# Patient Record
Sex: Male | Born: 1945 | Race: White | Hispanic: Yes | Marital: Married | Smoking: Former smoker
Health system: Southern US, Community
[De-identification: ages and names within clinical notes are randomized; demographics above are authoritative.]

## PROBLEM LIST (undated history)

## (undated) DIAGNOSIS — M1990 Unspecified osteoarthritis, unspecified site: Secondary | ICD-10-CM

## (undated) DIAGNOSIS — T7840XA Allergy, unspecified, initial encounter: Secondary | ICD-10-CM

## (undated) DIAGNOSIS — H269 Unspecified cataract: Secondary | ICD-10-CM

## (undated) DIAGNOSIS — F419 Anxiety disorder, unspecified: Secondary | ICD-10-CM

## (undated) HISTORY — DX: Anxiety disorder, unspecified: F41.9

## (undated) HISTORY — DX: Unspecified osteoarthritis, unspecified site: M19.90

## (undated) HISTORY — DX: Allergy, unspecified, initial encounter: T78.40XA

## (undated) HISTORY — DX: Unspecified cataract: H26.9

## (undated) HISTORY — PX: EYE SURGERY: SHX253

## (undated) HISTORY — PX: VASECTOMY: SHX75

---

## 2018-11-29 ENCOUNTER — Ambulatory Visit (INDEPENDENT_AMBULATORY_CARE_PROVIDER_SITE_OTHER): Payer: Self-pay | Admitting: Urology

## 2018-11-29 ENCOUNTER — Other Ambulatory Visit: Payer: Self-pay

## 2018-11-29 ENCOUNTER — Encounter: Payer: Self-pay | Admitting: Urology

## 2018-11-29 VITALS — BP 176/78 | HR 63 | Ht 72.0 in | Wt 223.0 lb

## 2018-11-29 DIAGNOSIS — R310 Gross hematuria: Secondary | ICD-10-CM

## 2018-11-29 LAB — URINALYSIS, COMPLETE
Bilirubin, UA: NEGATIVE
Glucose, UA: NEGATIVE
Ketones, UA: NEGATIVE
Leukocytes,UA: NEGATIVE
Nitrite, UA: NEGATIVE
Protein,UA: NEGATIVE
Specific Gravity, UA: 1.02 (ref 1.005–1.030)
Urobilinogen, Ur: 1 mg/dL (ref 0.2–1.0)
pH, UA: 6.5 (ref 5.0–7.5)

## 2018-11-29 LAB — MICROSCOPIC EXAMINATION: Bacteria, UA: NONE SEEN

## 2018-11-29 NOTE — Progress Notes (Signed)
11/29/2018 1:28 PM   Logan Hartman 07/29/1945 [id number]  Referring provider: Kennyth Lose, PA [address] [place], [place] [address]  Chief Complaint  Patient presents with  . Hematuria    HPI:  Logan Hartman was referred over for gross hematuria.  In several occasions he noted red urine with some small clots.  A urinalysis was done and although it was only a dip the color was noted is red appearing.  He had a negative KUB.  He was treated with Cefdinir. Abx seemed to resolve all symptoms including mild dysuria. He has occasional bilateral flank pain. No h/o stones, but he thought he may have seen something small pass when he voided. He noticed symptoms 4-6 weeks ago.   He smoked for about 15 yrs but stopped long ago. He served in [employer] in [place]. No chemo or XRT. 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   .Modifying factors: There are no other modifying factors  Associated signs and symptoms: There are no other associated signs and symptoms Aggravating and relieving factors: There are no other aggravating or relieving factors Severity: Moderate Duration: Persistent  PMH: History reviewed. No pertinent past medical history.  Surgical History: History reviewed. No pertinent surgical history.  Home Medications:  Allergies as of 11/29/2018   No Known Allergies     Medication List    as of November 29, 2018  1:28 PM   You have not been prescribed any medications.     Allergies: No Known Allergies  Family History: History reviewed. No pertinent family history.  Social History:  reports that he has quit smoking. He has never used smokeless tobacco. No history on file for alcohol and drug.  ROS: UROLOGY Frequent Urination?: No Hard to postpone urination?: No Burning/pain with urination?: No Get up at night to urinate?: No Leakage of urine?: No Urine stream starts and stops?: No Trouble starting stream?: No Do you have to strain to urinate?:  No Blood in urine?: Yes Urinary tract infection?: No Sexually transmitted disease?: No Injury to kidneys or bladder?: No Painful intercourse?: No Weak stream?: No Erection problems?: No Penile pain?: No  Gastrointestinal Nausea?: No Vomiting?: No Indigestion/heartburn?: No Diarrhea?: No Constipation?: No  Constitutional Fever: No Night sweats?: No Weight loss?: No Fatigue?: No  Skin Skin rash/lesions?: No Itching?: No  Eyes Blurred vision?: No Double vision?: No  Ears/Nose/Throat Sore throat?: No Sinus problems?: No  Hematologic/Lymphatic Swollen glands?: No Easy bruising?: No  Cardiovascular Leg swelling?: No Chest pain?: No  Respiratory Cough?: No Shortness of breath?: No  Endocrine Excessive thirst?: No  Musculoskeletal Back pain?: No Joint pain?: No  Neurological Headaches?: No Dizziness?: No  Psychologic Depression?: No Anxiety?: No  Physical Exam: BP (!) 176/78 (BP Location: Left Arm, Patient Position: Sitting, Cuff Size: Normal)   Pulse 63   Ht 6' (1.829 m)   Wt 101.2 kg   BMI 30.24 kg/m   Constitutional:  Alert and oriented, No acute distress. HEENT: [place] AT, moist mucus membranes.  Trachea midline, no masses. Cardiovascular: No clubbing, cyanosis, or edema. Respiratory: Normal respiratory effort, no increased work of breathing. GI: Abdomen is soft, nontender, nondistended, no abdominal masses GU: No CVA tenderness Lymph: No cervical or inguinal lymphadenopathy. Skin: No rashes, bruises or suspicious lesions. Neurologic: Grossly intact, no focal deficits, moving all 4 extremities. Psychiatric: Normal mood and affect. GU: Penis circumcised, normal foreskin, testicles descended bilaterally and palpably normal, bilateral epididymis palpably normal, scrotum normal DRE: Prostate 40  g, smooth without hard area or nodule  Laboratory Data: No results found for: WBC, HGB, HCT, MCV, PLT  No results found for: CREATININE  No results  found for: PSA  No results found for: TESTOSTERONE  No results found for: HGBA1C  Urinalysis No results found for: COLORURINE, APPEARANCEUR, LABSPEC, PHURINE, GLUCOSEU, HGBUR, BILIRUBINUR, KETONESUR, PROTEINUR, UROBILINOGEN, NITRITE, LEUKOCYTESUR  No results found for: LABMICR, WBCUA, RBCUA, LABEPIT, MUCUS, BACTERIA  Pertinent Imaging:  No results found for this or any previous visit. No results found for this or any previous visit. No results found for this or any previous visit. No results found for this or any previous visit. No results found for this or any previous visit. No results found for this or any previous visit. No results found for this or any previous visit. No results found for this or any previous visit.  Assessment & Plan:    1. Gross hematuria Discussed eval with CT with IV and cystoscopy. Also PSA was sent.   - Urinalysis, Complete   No follow-ups on file.  Jerilee FieldMatthew Annabelle Rexroad, MD  [facility] [address], [address] [place], [place] [id number] [phone]

## 2018-11-30 LAB — PSA: Prostate Specific Ag, Serum: 0.8 ng/mL (ref 0.0–4.0)

## 2018-12-06 ENCOUNTER — Telehealth: Payer: Self-pay | Admitting: Urology

## 2018-12-09 ENCOUNTER — Other Ambulatory Visit
Admission: RE | Admit: 2018-12-09 | Discharge: 2018-12-09 | Disposition: A | Discharge status: Home / Self Care | Payer: Non-veteran care | Attending: Urology | Admitting: Urology

## 2018-12-09 ENCOUNTER — Ambulatory Visit
Admission: RE | Admit: 2018-12-09 | Discharge: 2018-12-09 | Disposition: A | Discharge status: Home / Self Care | Payer: Self-pay | Source: Ambulatory Visit | Attending: Urology | Admitting: Urology

## 2018-12-09 ENCOUNTER — Other Ambulatory Visit: Payer: Self-pay

## 2018-12-09 DIAGNOSIS — R310 Gross hematuria: Secondary | ICD-10-CM | POA: Insufficient documentation

## 2018-12-09 LAB — CREATININE, SERUM
Creatinine, Ser: 0.69 mg/dL (ref 0.61–1.24)
GFR calc Af Amer: >60 mL/min (ref >60)
GFR calc non Af Amer: >60 mL/min (ref >60)

## 2018-12-09 LAB — BUN: BUN: 12 mg/dL (ref 8–23)

## 2018-12-09 MED ORDER — IOHEXOL 300 MG/ML  SOLN
125.0000 mL | Freq: Once | INTRAMUSCULAR | Status: AC | PRN
  Administered 2018-12-09: 125 mL via INTRAVENOUS

## 2018-12-11 ENCOUNTER — Other Ambulatory Visit: Payer: Self-pay

## 2018-12-11 ENCOUNTER — Ambulatory Visit (INDEPENDENT_AMBULATORY_CARE_PROVIDER_SITE_OTHER): Payer: Self-pay | Admitting: Urology

## 2018-12-11 ENCOUNTER — Encounter: Payer: Self-pay | Admitting: Urology

## 2018-12-11 VITALS — BP 172/82 | HR 57 | Ht 72.0 in | Wt 223.0 lb

## 2018-12-11 DIAGNOSIS — R310 Gross hematuria: Secondary | ICD-10-CM

## 2018-12-11 MED ORDER — LIDOCAINE HCL URETHRAL/MUCOSAL 2 % EX GEL
1.0000 "application " | Freq: Once | CUTANEOUS | Status: AC
  Administered 2018-12-11: 1 via URETHRAL

## 2018-12-11 NOTE — Progress Notes (Signed)
12/11/2018 2:46 PM   Charlesetta ShanksGilbert Michl 03-Sep-1945 [id number]  Referring provider: No referring provider defined for this encounter.  Chief Complaint  Patient presents with  . Hematuria    HPI:  Mr. Logan Hartman returns in evaluation for gross hematuria. On several occasions he noted red urine with some small clots prior to 11/29/2018.  A urinalysis was done and although it was only a dip the color was noted is red appearing.  He had a negative KUB.  He was treated with Cefdinir. Abx seemed to resolve all symptoms including mild dysuria. He has occasional bilateral flank pain. No h/o stones, but he thought he may have seen something small pass when he voided. He noticed symptoms 4-6 weeks ago.   He smoked for about 15 yrs but stopped long ago. He served in [employer] in [place]. No chemo or XRT. 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   He underwent CT scan a/p with IV contrast 12/09/2018 which was benign. He is here to discuss CT and for cystoscopy. I reviewed all the images.    PMH: No past medical history on file.  Surgical History: No past surgical history on file.  Home Medications:  Allergies as of 12/11/2018   No Known Allergies     Medication List    as of December 11, 2018  2:46 PM   You have not been prescribed any medications.     Allergies: No Known Allergies  Family History: No family history on file.  Social History:  reports that he has quit smoking. He has never used smokeless tobacco. No history on file for alcohol and drug.  ROS:                                        Physical Exam:  NED. A&Ox3.   No respiratory distress   Abd soft, NT, ND Normal phallus with bilateral descended testicles, meatus normal   Cystoscopy Procedure Note  Patient identification was confirmed, informed consent was obtained, and patient was prepped using Betadine solution.  Lidocaine jelly was administered per urethral meatus.      Pre-Procedure: - Inspection reveals a normal caliber ureteral meatus.  Procedure: The flexible cystoscope was introduced without difficulty - No urethral strictures/lesions are present. - Enlarged prostate , mild hyperplasia, borderline obstructing, friable vessels near the veru - Normal bladder neck - Bilateral ureteral orifices identified - Bladder mucosa  reveals no ulcers, tumors, or lesions - No bladder stones - No trabeculation  Retroflexion shows normal bladder neck/bladder.   Post-Procedure: - Patient tolerated the procedure well   Laboratory Data: No results found for: WBC, HGB, HCT, MCV, PLT  Lab Results  Component Value Date   CREATININE 0.69 12/09/2018    No results found for: PSA  No results found for: TESTOSTERONE  No results found for: HGBA1C  Urinalysis    Component Value Date/Time   APPEARANCEUR Clear 11/29/2018 1315   GLUCOSEU Negative 11/29/2018 1315   BILIRUBINUR Negative 11/29/2018 1315   PROTEINUR Negative 11/29/2018 1315   NITRITE Negative 11/29/2018 1315   LEUKOCYTESUR Negative 11/29/2018 1315    Lab Results  Component Value Date   LABMICR See below: 11/29/2018   WBCUA 0-5 11/29/2018   LABEPIT 0-10 11/29/2018   BACTERIA None seen 11/29/2018    Pertinent Imaging: CT scan No results found for this or any previous visit. No results found for  this or any previous visit. No results found for this or any previous visit. No results found for this or any previous visit. No results found for this or any previous visit. No results found for this or any previous visit. No results found for this or any previous visit. No results found for this or any previous visit.  Assessment & Plan:    1. Gross hematuria Benign eval - see back in 1 year for UA and H&P or sooner if any flank pain, gross hematuria, dysuria/LUTS, etc.  - Urinalysis, Complete - lidocaine (XYLOCAINE) 2 % jelly 1 application   No follow-ups on file.  Festus Aloe, MD  [facility] [address], [place], [place] [address] [phone]

## 2018-12-11 NOTE — Patient Instructions (Signed)
Cystoscopy    Cystoscopy is a procedure that is used to help diagnose and sometimes treat conditions that affect that lower urinary tract. The lower urinary tract includes the bladder and the tube that drains urine from the bladder out of the body (urethra). Cystoscopy is performed with a thin, tube-shaped instrument with a light and camera at the end (cystoscope). The cystoscope may be hard (rigid) or flexible, depending on the goal of the procedure.The cystoscope is inserted through the urethra, into the bladder.  Cystoscopy may be recommended if you have:   Urinary tractinfections that keep coming back (recurring).   Blood in the urine (hematuria).   Loss of bladder control (urinary incontinence) or an overactive bladder.   Unusual cells found in a urine sample.   A blockage in the urethra.   Painful urination.   An abnormality in the bladder found during an intravenous pyelogram (IVP) or CT scan.  Cystoscopy may also be done to remove a sample of tissue to be examined under a microscope (biopsy).  Tell a health care provider about:   Any allergies you have.   All medicines you are taking, including vitamins, herbs, eye drops, creams, and over-the-counter medicines.   Any problems you or family members have had with anesthetic medicines.   Any blood disorders you have.   Any surgeries you have had.   Any medical conditions you have.   Whether you are pregnant or may be pregnant.  What are the risks?  Generally, this is a safe procedure. However, problems may occur, including:   Infection.   Bleeding.   Allergic reactions to medicines.   Damage to other structures or organs.  What happens before the procedure?   Ask your health care provider about:  ? Changing or stopping your regular medicines. This is especially important if you are taking diabetes medicines or blood thinners.  ? Taking medicines such as aspirin and ibuprofen. These medicines can thin your blood. Do not take these medicines  before your procedure if your health care provider instructs you not to.   Follow instructions from your health care provider about eating or drinking restrictions.   You may be given antibiotic medicine to help prevent infection.   You may have an exam or testing, such as X-rays of the bladder, urethra, or kidneys.   You may have urine tests to check for signs of infection.   Plan to have someone take you home after the procedure.  What happens during the procedure?   To reduce your risk of infection,your health care team will wash or sanitize their hands.   You will be given one or more of the following:  ? A medicine to help you relax (sedative).  ? A medicine to numb the area (local anesthetic).   The area around the opening of your urethra will be cleaned.   The cystoscope will be passed through your urethra into your bladder.   Germ-free (sterile)fluid will flow through the cystoscope to fill your bladder. The fluid will stretch your bladder so that your surgeon can clearly examine your bladder walls.   The cystoscope will be removed and your bladder will be emptied.  The procedure may vary among health care providers and hospitals.  What happens after the procedure?   You may have some soreness or pain in your abdomen and urethra. Medicines will be available to help you.   You may have some blood in your urine.   Do not drive for   24 hours if you received a sedative.  This information is not intended to replace advice given to you by your health care provider. Make sure you discuss any questions you have with your health care provider.  Document Released: 06/09/2000 Document Revised: 03/23/2017 Document Reviewed: 04/29/2015  Elsevier Interactive Patient Education  2019 Elsevier Inc.

## 2018-12-12 LAB — URINALYSIS, COMPLETE
Bilirubin, UA: NEGATIVE
Glucose, UA: NEGATIVE
Ketones, UA: NEGATIVE
Leukocytes,UA: NEGATIVE
Nitrite, UA: NEGATIVE
Protein,UA: NEGATIVE
RBC, UA: NEGATIVE
Specific Gravity, UA: 1.02 (ref 1.005–1.030)
Urobilinogen, Ur: 2 mg/dL — ABNORMAL HIGH (ref 0.2–1.0)
pH, UA: 7 (ref 5.0–7.5)

## 2018-12-12 LAB — MICROSCOPIC EXAMINATION
Bacteria, UA: NONE SEEN
RBC, Urine: NONE SEEN /hpf (ref 0–2)

## 2019-01-18 NOTE — Telephone Encounter (Signed)
error 

## 2019-10-24 ENCOUNTER — Ambulatory Visit (INDEPENDENT_AMBULATORY_CARE_PROVIDER_SITE_OTHER): Payer: Medicaid Other | Admitting: Urology

## 2019-10-24 ENCOUNTER — Other Ambulatory Visit: Payer: Self-pay

## 2019-10-24 VITALS — BP 181/95 | HR 69 | Ht 68.0 in | Wt 237.0 lb

## 2019-10-24 DIAGNOSIS — N5201 Erectile dysfunction due to arterial insufficiency: Secondary | ICD-10-CM | POA: Diagnosis not present

## 2019-10-24 DIAGNOSIS — R310 Gross hematuria: Secondary | ICD-10-CM | POA: Diagnosis not present

## 2019-10-24 MED ORDER — SILDENAFIL CITRATE 20 MG PO TABS: 20.0000 mg | ORAL_TABLET | Freq: Every day | ORAL | 11 refills | Status: DC

## 2019-10-24 NOTE — Patient Instructions (Signed)
Sildenafil tablets (Erectile Dysfunction) What is this medicine? SILDENAFIL (sil DEN a fil) is used to treat erection problems in men. This medicine may be used for other purposes; ask your health care provider or pharmacist if you have questions. COMMON BRAND NAME(S): Viagra What should I tell my health care provider before I take this medicine? They need to know if you have any of these conditions:  bleeding disorders  eye or vision problems, including a rare inherited eye disease called retinitis pigmentosa  anatomical deformation of the penis, Peyronie's disease, or history of priapism (painful and prolonged erection)  heart disease, angina, a history of heart attack, irregular heart beats, or other heart problems  high or low blood pressure  history of blood diseases, like sickle cell anemia or leukemia  history of stomach bleeding  kidney disease  liver disease  stroke  an unusual or allergic reaction to sildenafil, other medicines, foods, dyes, or preservatives  pregnant or trying to get pregnant  breast-feeding How should I use this medicine? Take this medicine by mouth with a glass of water. Follow the directions on the prescription label. The dose is usually taken 1 hour before sexual activity. You should not take the dose more than once per day. Do not take your medicine more often than directed. Talk to your pediatrician regarding the use of this medicine in children. This medicine is not used in children for this condition. Overdosage: If you think you have taken too much of this medicine contact a poison control center or emergency room at once. NOTE: This medicine is only for you. Do not share this medicine with others. What if I miss a dose? This does not apply. Do not take double or extra doses. What may interact with this medicine? Do not take this medicine with any of the following medications:  cisapride  nitrates like amyl nitrite, isosorbide  dinitrate, isosorbide mononitrate, nitroglycerin  riociguat This medicine may also interact with the following medications:  antiviral medicines for HIV or AIDS  bosentan  certain medicines for benign prostatic hyperplasia (BPH)  certain medicines for blood pressure  certain medicines for fungal infections like ketoconazole and itraconazole  cimetidine  erythromycin  rifampin This list may not describe all possible interactions. Give your health care provider a list of all the medicines, herbs, non-prescription drugs, or dietary supplements you use. Also tell them if you smoke, drink alcohol, or use illegal drugs. Some items may interact with your medicine. What should I watch for while using this medicine? If you notice any changes in your vision while taking this drug, call your doctor or health care professional as soon as possible. Stop using this medicine and call your health care provider right away if you have a loss of sight in one or both eyes. Contact your doctor or health care professional right away if you have an erection that lasts longer than 4 hours or if it becomes painful. This may be a sign of a serious problem and must be treated right away to prevent permanent damage. If you experience symptoms of nausea, dizziness, chest pain or arm pain upon initiation of sexual activity after taking this medicine, you should refrain from further activity and call your doctor or health care professional as soon as possible. Do not drink alcohol to excess (examples, 5 glasses of wine or 5 shots of whiskey) when taking this medicine. When taken in excess, alcohol can increase your chances of getting a headache or getting dizzy, increasing   your heart rate or lowering your blood pressure. Using this medicine does not protect you or your partner against HIV infection (the virus that causes AIDS) or other sexually transmitted diseases. What side effects may I notice from receiving this  medicine? Side effects that you should report to your doctor or health care professional as soon as possible:  allergic reactions like skin rash, itching or hives, swelling of the face, lips, or tongue  breathing problems  changes in hearing  changes in vision  chest pain  fast, irregular heartbeat  prolonged or painful erection  seizures Side effects that usually do not require medical attention (report to your doctor or health care professional if they continue or are bothersome):  back pain  dizziness  flushing  headache  indigestion  muscle aches  nausea  stuffy or runny nose This list may not describe all possible side effects. Call your doctor for medical advice about side effects. You may report side effects to FDA at 1-800-FDA-1088. Where should I keep my medicine? Keep out of reach of children. Store at room temperature between 15 and 30 degrees C (59 and 86 degrees F). Throw away any unused medicine after the expiration date. NOTE: This sheet is a summary. It may not cover all possible information. If you have questions about this medicine, talk to your doctor, pharmacist, or health care provider.  2020 Elsevier/Gold Standard (2015-05-26 12:00:25)  

## 2019-10-24 NOTE — Progress Notes (Signed)
10/24/2019 8:47 AM   Logan Hartman August 23, 1945 [id number]  Referring provider: No referring provider defined for this encounter.  No chief complaint on file.   HPI:  Logan Hartman returns in evaluation for gross hematuria. On several occasions he noted red urine with some smallclots prior to 11/29/2018. A urinalysis was done and although it was only a dip the color was noted is red appearing. He had a negative KUB. He was treated with Cefdinir. Abx seemed to resolve all symptoms including mild dysuria. He has occasional bilateral flank pain. No h/o stones.  He smoked for about 15 yrs but stopped long ago. He served in [employer] in [place]. No chemo or XRT. 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   He underwent CT scan a/p with IV contrast 12/09/2018 which was benign. Cystoscopy benign 12/11/2018. His PSA was 0.8 in 2020.   He returns today and has had no gross hematuria or flank pain. His stream is slower in the morning and then runs "pretty good" after he is hydrated.   He has a new issue with keeping an erection. He hasn't tried anything. He has a good libido. Going on for several months.   PMH: No past medical history on file.  Surgical History: No past surgical history on file.  Home Medications:  Allergies as of 10/24/2019   No Known Allergies     Medication List    as of October 24, 2019  8:47 AM   You have not been prescribed any medications.     Allergies: No Known Allergies  Family History: No family history on file.  Social History:  reports that he has quit smoking. He has never used smokeless tobacco. No history on file for alcohol and drug.   Physical Exam: There were no vitals taken for this visit.  Constitutional:  Alert and oriented, No acute distress. HEENT: [place] AT, moist mucus membranes.  Trachea midline, no masses. Cardiovascular: No clubbing, cyanosis, or edema. Respiratory: Normal respiratory effort, no increased work of  breathing. GI: Abdomen is soft, nontender, nondistended, no abdominal masses GU: No CVA tenderness; DRE - prostate about 30 g, smooth without hard area or nodule Lymph: No cervical or inguinal lymphadenopathy. Skin: No rashes, bruises or suspicious lesions. Neurologic: Grossly intact, no focal deficits, moving all 4 extremities. Psychiatric: Normal mood and affect.  Laboratory Data: No results found for: WBC, HGB, HCT, MCV, PLT  Lab Results  Component Value Date   CREATININE 0.69 12/09/2018    No results found for: PSA  No results found for: TESTOSTERONE  No results found for: HGBA1C  Urinalysis    Component Value Date/Time   APPEARANCEUR Clear 12/11/2018 1436   GLUCOSEU Negative 12/11/2018 1436   BILIRUBINUR Negative 12/11/2018 1436   PROTEINUR Negative 12/11/2018 1436   NITRITE Negative 12/11/2018 1436   LEUKOCYTESUR Negative 12/11/2018 1436    Lab Results  Component Value Date   LABMICR See below: 12/11/2018   WBCUA 0-5 12/11/2018   LABEPIT 0-10 12/11/2018   BACTERIA None seen 12/11/2018    Pertinent Imaging: n/a No results found for this or any previous visit. No results found for this or any previous visit. No results found for this or any previous visit. No results found for this or any previous visit. No results found for this or any previous visit. No results found for this or any previous visit. No results found for this or any previous visit. No results found for this or any previous visit.  Assessment & Plan:    1. Gross hematuria No recurrence, self limited  - Urinalysis, Complete  2. ED - we discussed the nature r/b/a to sildenafil and he will proceed.   No follow-ups on file.  Jerilee Field, MD  [facility] [address], [address] [place], [place] [address] [phone]

## 2019-12-05 ENCOUNTER — Ambulatory Visit: Payer: Self-pay | Admitting: Urology

## 2020-04-29 IMAGING — CT CT ABDOMEN AND PELVIS WITHOUT AND WITH CONTRAST
3 of 7 series · 13 of 32 positions shown, 18 images · IV contrast (omnipaque)
Comparison: None.

CLINICAL DATA: Patient with history of gross hematuria.

EXAM:
CT ABDOMEN AND PELVIS WITHOUT AND WITH CONTRAST
TECHNIQUE: Multidetector CT imaging of the abdomen and pelvis was performed
following the standard protocol before and following the bolus
administration of intravenous contrast.
CONTRAST:  125mL OMNIPAQUE IOHEXOL 300 MG/ML  SOLN

[Series 2: axial pre · axial · non-contrast · 0.79mm/px · z∈[-480,-320]mm · 3 of 95 slices shown]
[im 16/95  soft-tissue]
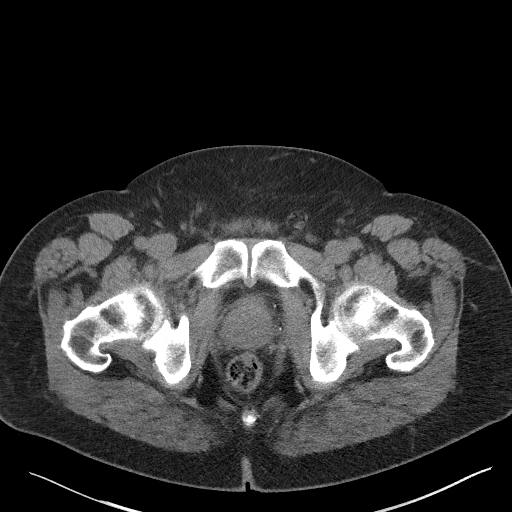
[im 32/95  soft-tissue]
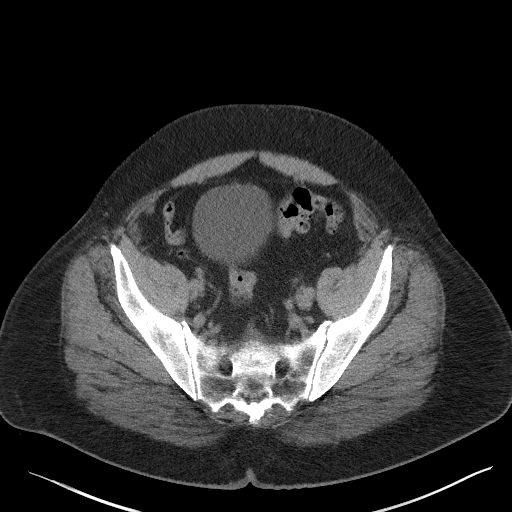
[im 48/95  soft-tissue]
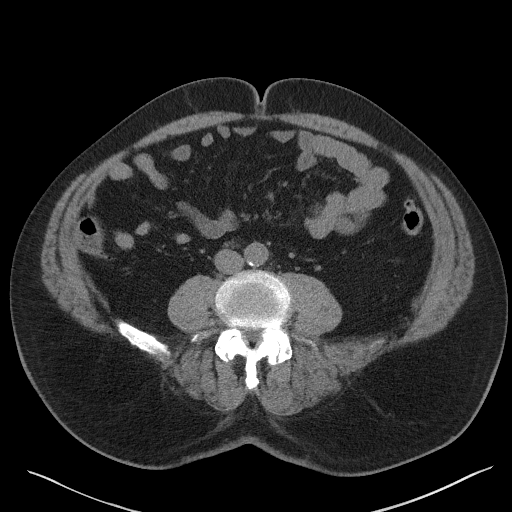

[Series 13: axial delay · axial · delayed · 0.74mm/px · z∈[-524,-189]mm · 5 of 101 slices shown, 10 images (1 of 2)]
[im 17/101  soft-tissue]
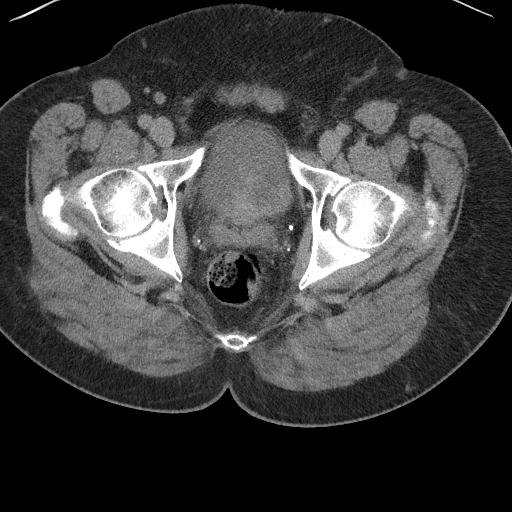
[im 17/101  bone]
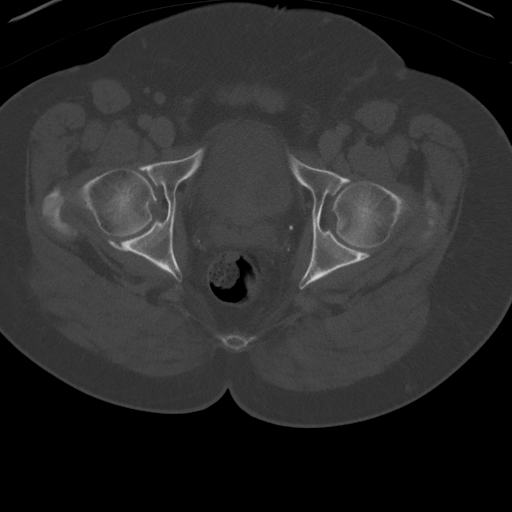
[im 34/101  soft-tissue]
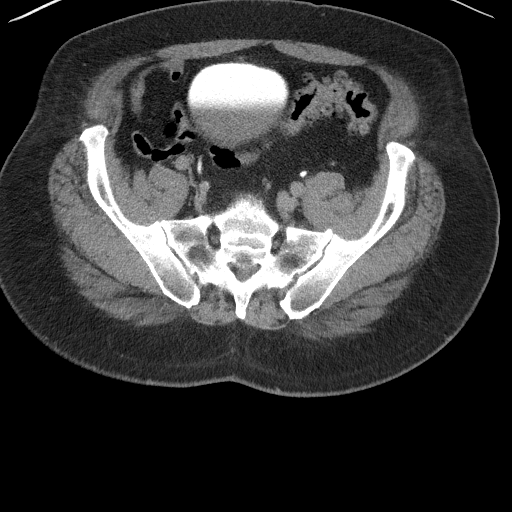
[im 34/101  lung]
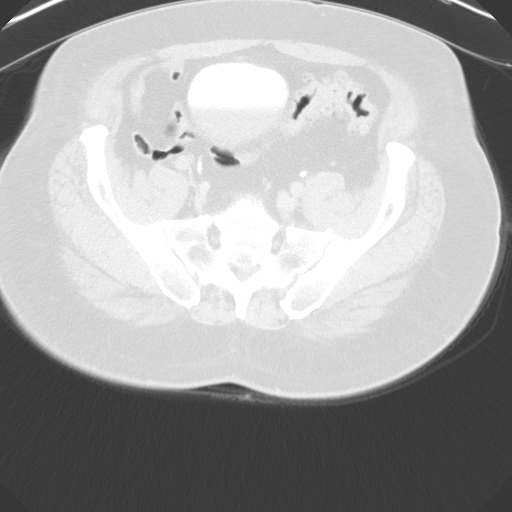
[im 51/101  soft-tissue]
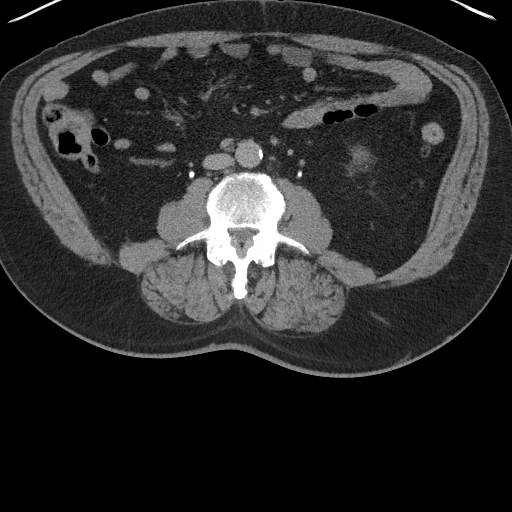
[im 51/101  lung]
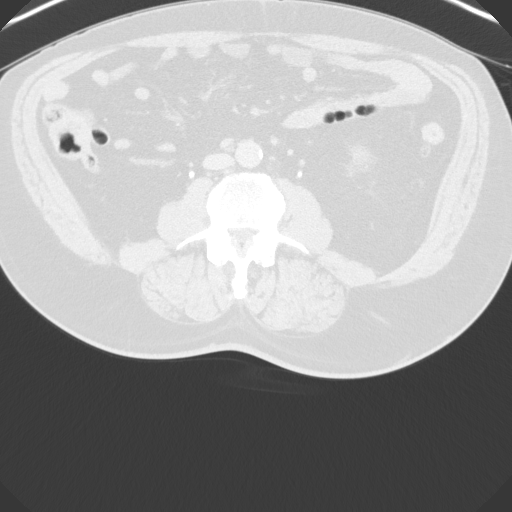
[im 67/101  soft-tissue]
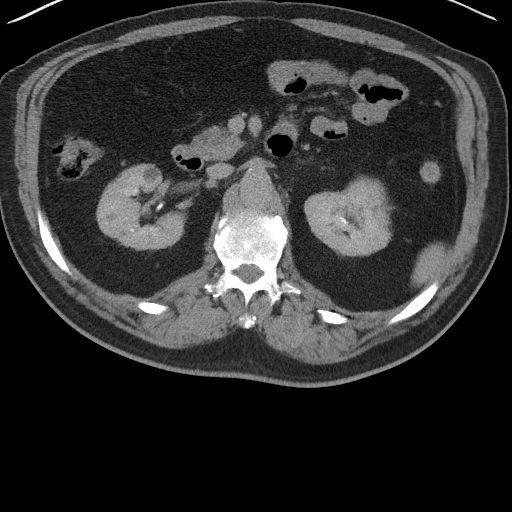
[im 67/101  lung]
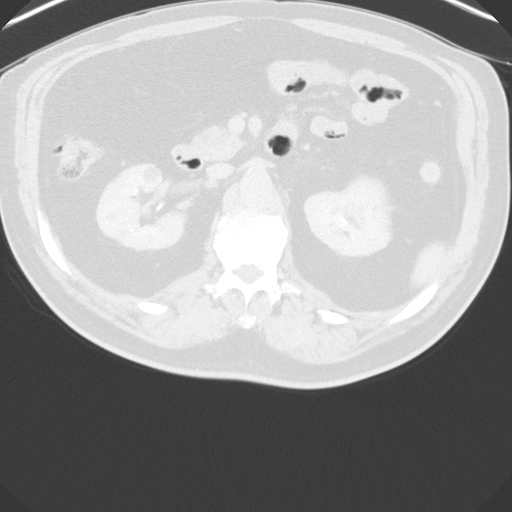
[im 84/101  soft-tissue]
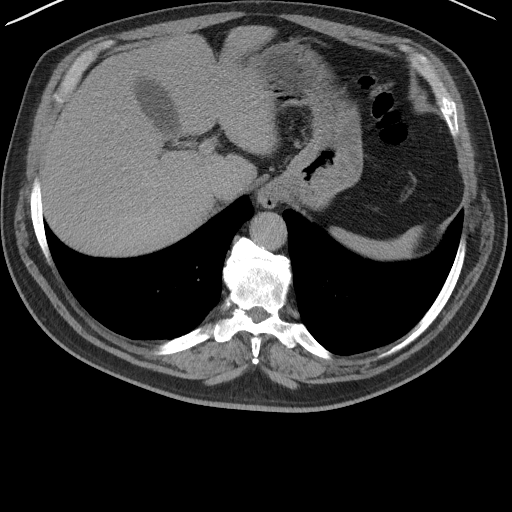
[im 84/101  lung]
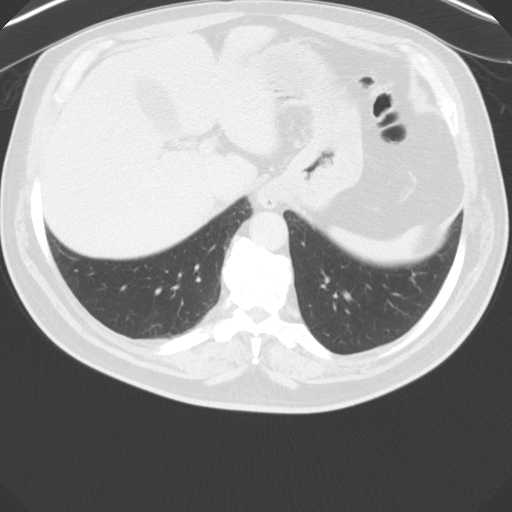

[Series 15: axial delay · axial · delayed · 0.77mm/px · z∈[-524,-189]mm · 5 of 101 slices shown (2 of 2)]
[im 17/101  soft-tissue]
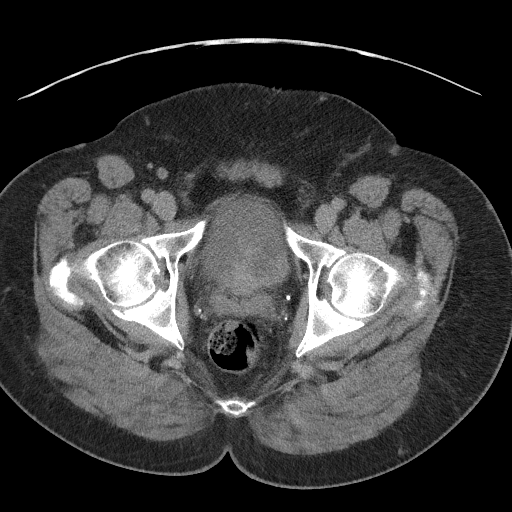
[im 34/101  soft-tissue]
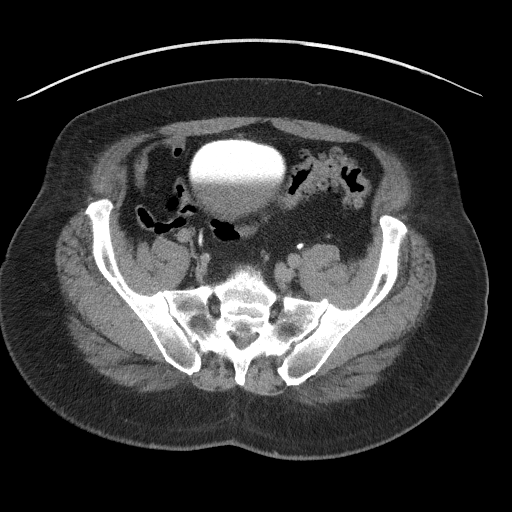
[im 51/101  soft-tissue]
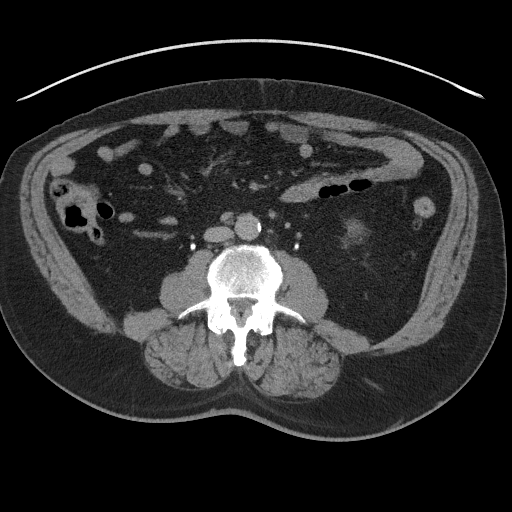
[im 67/101  soft-tissue]
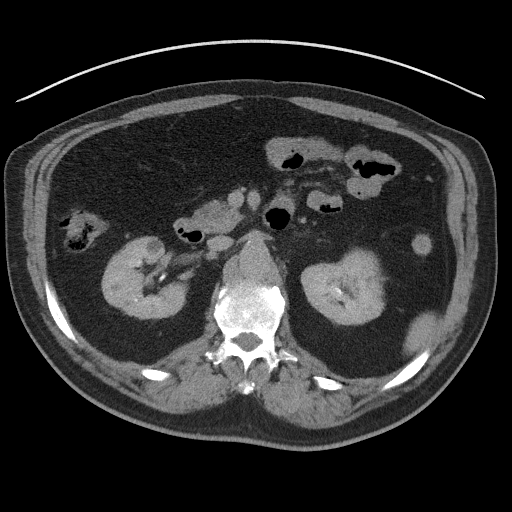
[im 84/101  soft-tissue]
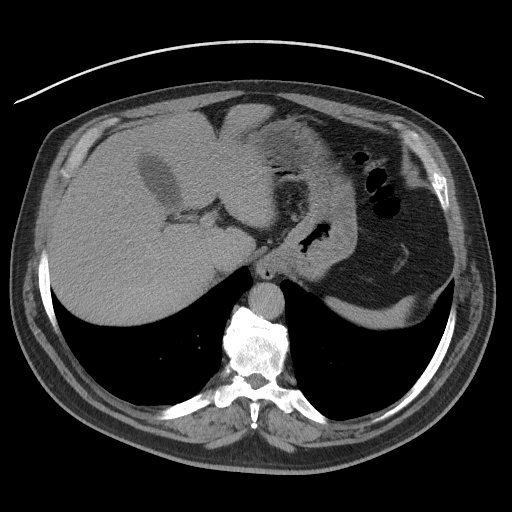

[13 of 32 positions shown; findings below may reference images not displayed]

FINDINGS: Lower chest: Normal heart size. Small calcified granuloma within the
right lower lobe. No pleural effusion or pneumothorax.

Hepatobiliary: The liver is normal in size and contour. Focal
hepatic lesion is identified. Gallbladder is unremarkable. No
intrahepatic or extrahepatic biliary ductal dilatation.

Pancreas: Unremarkable

Spleen: Unremarkable

Adrenals/Urinary Tract: Normal adrenal glands. Noncontrast images
demonstrate a punctate stone within the interpolar region right
kidney (image 59; series 6). Additionally, there is a 2 mm
calcification within the interpolar region right kidney (image 69;
series 6) which may represent small stone versus intraparenchymal
calcification. No ureterolithiasis. No hydronephrosis. Kidneys
enhance symmetrically with contrast. Multiple bilateral
low-attenuation renal lesions are demonstrated, the largest of which
are compatible with simple cysts. The majority of these lesions are
too small to characterize. Delayed images demonstrate excretion of
contrast material into the bilateral renal collecting systems and
ureters. The opacified portions are unremarkable. No abnormal
filling defects are identified. Urinary bladder is unremarkable.

Stomach/Bowel: Sigmoid colonic diverticulosis. No CT evidence for
acute diverticulitis. Normal appendix. Normal morphology of the
stomach. No evidence for small bowel obstruction. No free fluid or
free intraperitoneal air.

Vascular/Lymphatic: Normal caliber abdominal aorta. Prominent
subcentimeter retroperitoneal lymph nodes.

Reproductive: Coarse heterogeneous calcifications in the prostate.

Other: Small fat containing right inguinal hernia.

Musculoskeletal: Lumbar spine degenerative changes. No aggressive or
acute appearing osseous lesions.
IMPRESSION: 1. There is a punctate stone within the midpole of the right kidney.
There is an additional calcification within the midpole right kidney
which may represent small stone versus parenchymal calcification.
2. No ureterolithiasis or hydronephrosis. Abnormal filling defects
within the opacified renal collecting systems and ureters.
3. Multiple bilateral low-attenuation renal lesions, the largest of
which are compatible with simple cysts. Many of these lesions are
too small to characterize, particularly given the intraparenchymal
location.

## 2020-10-22 ENCOUNTER — Ambulatory Visit: Payer: Self-pay | Admitting: Urology

## 2020-11-12 ENCOUNTER — Encounter: Payer: Self-pay | Admitting: Urology

## 2020-11-12 ENCOUNTER — Ambulatory Visit (INDEPENDENT_AMBULATORY_CARE_PROVIDER_SITE_OTHER): Payer: Medicaid Other | Admitting: Urology

## 2020-11-12 ENCOUNTER — Other Ambulatory Visit: Payer: Self-pay

## 2020-11-12 VITALS — BP 184/82 | HR 59 | Ht 67.0 in | Wt 215.0 lb

## 2020-11-12 DIAGNOSIS — R310 Gross hematuria: Secondary | ICD-10-CM | POA: Diagnosis not present

## 2020-11-12 DIAGNOSIS — N5201 Erectile dysfunction due to arterial insufficiency: Secondary | ICD-10-CM

## 2020-11-12 LAB — URINALYSIS, COMPLETE
Bilirubin, UA: NEGATIVE
Glucose, UA: NEGATIVE
Ketones, UA: NEGATIVE
Leukocytes,UA: NEGATIVE
Nitrite, UA: NEGATIVE
Protein,UA: NEGATIVE
RBC, UA: NEGATIVE
Specific Gravity, UA: 1.01 (ref 1.005–1.030)
Urobilinogen, Ur: 1 mg/dL (ref 0.2–1.0)
pH, UA: 6.5 (ref 5.0–7.5)

## 2020-11-12 LAB — MICROSCOPIC EXAMINATION
Bacteria, UA: NONE SEEN
RBC, Urine: NONE SEEN /hpf (ref 0–2)

## 2020-11-12 MED ORDER — SILDENAFIL CITRATE 20 MG PO TABS: 20.0000 mg | ORAL_TABLET | Freq: Every day | ORAL | 3 refills | Status: DC

## 2020-11-12 NOTE — Progress Notes (Signed)
   11/12/2020 9:50 AM   Logan Hartman 1946/04/15 [id number]  Referring provider: No referring provider defined for this encounter.  Chief Complaint  Patient presents with  . Hematuria    HPI: 75 y.o. male presents for annual follow-up.     Previously followed by Dr. Mena Goes for gross hematuria  CT urogram 11/2018 without upper tract abnormalities  Cystoscopy 11/2018 remarkable for prostate enlargement with hypervascularity/friability  Last visit 09/2019 and denied recurrent gross hematuria and was started on sildenafil for ED  Denies recurrent hematuria or voiding complaints  Sildenafil has been effective and a 20 mg dose   PMH: History reviewed. No pertinent past medical history.  Surgical History: History reviewed. No pertinent surgical history.  Home Medications:  Allergies as of 11/12/2020   No Known Allergies     Medication List       Accurate as of Nov 12, 2020  9:50 AM. If you have any questions, ask your nurse or doctor.        sildenafil 20 MG tablet Commonly known as: REVATIO Take 1 tablet (20 mg total) by mouth daily. Take 1-5 tablets as needed       Allergies: No Known Allergies  Family History: History reviewed. No pertinent family history.  Social History:  reports that he has quit smoking. He has never used smokeless tobacco. No history on file for alcohol use and drug use.   Physical Exam: BP (!) 184/82   Pulse (!) 59   Ht 5\' 7"  (1.702 m)   Wt 215 lb (97.5 kg)   BMI 33.67 kg/m   Constitutional:  Alert and oriented, No acute distress. HEENT: [place] AT, moist mucus membranes.  Trachea midline, no masses. Cardiovascular: No clubbing, cyanosis, or edema. Respiratory: Normal respiratory effort, no increased work of breathing.    Assessment & Plan:    1. Gross hematuria  Stable, no recurrent gross hematuria  2.  Erectile dysfunction  Stable  Sildenafil effective at 20 mg dose  Refill sent to pharmacy  Follow-up 1  year   , MD  [facility] [address] [place], [place] [phone]

## 2020-12-29 ENCOUNTER — Other Ambulatory Visit: Payer: Self-pay | Admitting: *Deleted

## 2020-12-29 MED ORDER — SILDENAFIL CITRATE 20 MG PO TABS: 20.0000 mg | ORAL_TABLET | Freq: Every day | ORAL | 3 refills | Status: DC

## 2021-06-07 ENCOUNTER — Encounter: Payer: Self-pay | Admitting: Urology

## 2021-11-14 ENCOUNTER — Ambulatory Visit: Payer: Self-pay | Admitting: Urology

## 2021-11-16 ENCOUNTER — Encounter: Payer: Self-pay | Admitting: Urology

## 2021-11-16 ENCOUNTER — Ambulatory Visit (INDEPENDENT_AMBULATORY_CARE_PROVIDER_SITE_OTHER): Payer: Medicare Other | Admitting: Urology

## 2021-11-16 VITALS — BP 140/72 | HR 68 | Ht 67.0 in | Wt 210.0 lb

## 2021-11-16 DIAGNOSIS — N5201 Erectile dysfunction due to arterial insufficiency: Secondary | ICD-10-CM | POA: Diagnosis not present

## 2021-11-16 MED ORDER — SILDENAFIL CITRATE 20 MG PO TABS: 20.0000 mg | ORAL_TABLET | Freq: Every day | ORAL | 3 refills | Status: DC

## 2021-11-16 NOTE — Progress Notes (Signed)
   11/16/2021 11:29 AM   Logan Hartman 1945/09/27 [id number]  Referring provider: No referring provider defined for this encounter.  Chief Complaint  Patient presents with   Erectile Dysfunction    Urologic history: 1.  Gross hematuria June 2020: CTU unremarkable; cystoscopy BPH with prominent vasculature/friability  2.  Erectile dysfunction Sildenafil prn  HPI: 76 y.o. male presents for annual follow-up.    Doing well since last visit No bothersome LUTS Denies dysuria, recurrent gross hematuria Denies flank, abdominal or pelvic pain Sildenafil effective at 20 mg dose  PMH: History reviewed. No pertinent past medical history.  Surgical History: History reviewed. No pertinent surgical history.  Home Medications:  Allergies as of 11/16/2021   No Known Allergies      Medication List        Accurate as of Nov 16, 2021 11:29 AM. If you have any questions, ask your nurse or doctor.          sildenafil 20 MG tablet Commonly known as: REVATIO Take 1 tablet (20 mg total) by mouth daily. Take 1-5 tablets as needed        Allergies: No Known Allergies  Family History: History reviewed. No pertinent family history.  Social History:  reports that he has quit smoking. He has never used smokeless tobacco. No history on file for alcohol use and drug use.   Physical Exam: BP 140/72   Pulse 68   Ht 5\' 7"  (1.702 m)   Wt 210 lb (95.3 kg)   BMI 32.89 kg/m   Constitutional:  Alert and oriented, No acute distress. HEENT: [place] AT, moist mucus membranes.  Trachea midline, no masses. Cardiovascular: No clubbing, cyanosis, or edema. Respiratory: Normal respiratory effort, no increased work of breathing.    Assessment & Plan:    1.  Erectile dysfunction Stable Sildenafil effective at 20 mg dose Refill sent to pharmacy  Follow-up 1 year   , MD  [facility] [address], [address] [place], [place] [phone]

## 2022-02-14 DIAGNOSIS — Z961 Presence of intraocular lens: Secondary | ICD-10-CM | POA: Diagnosis not present

## 2022-02-14 DIAGNOSIS — H1133 Conjunctival hemorrhage, bilateral: Secondary | ICD-10-CM | POA: Diagnosis not present

## 2022-02-14 DIAGNOSIS — H35363 Drusen (degenerative) of macula, bilateral: Secondary | ICD-10-CM | POA: Diagnosis not present

## 2022-05-12 ENCOUNTER — Ambulatory Visit (LOCAL_COMMUNITY_HEALTH_CENTER): Payer: No Typology Code available for payment source

## 2022-05-12 DIAGNOSIS — Z23 Encounter for immunization: Secondary | ICD-10-CM | POA: Diagnosis not present

## 2022-05-12 DIAGNOSIS — Z719 Counseling, unspecified: Secondary | ICD-10-CM

## 2022-05-12 NOTE — Progress Notes (Signed)
  Are you feeling sick today? No   Have you ever received a dose of COVID-19 Vaccine? (Pfizer, Janssen, Moderna, Novavax, Other) Yes  If yes, which vaccine and how many doses?   PFIZER, 4   Did you bring the vaccination record card or other documentation?  No   Do you have a health condition or are undergoing treatment that makes you moderately or severely immunocompromised? This would include, but not be limited to: cancer, HIV, organ transplant, immunosuppressive therapy/high-dose corticosteroids, or moderate/severe primary immunodeficiency.  No  Have you received COVID-19 vaccine before or during hematopoietic cell transplant (HCT) or CAR-T-cell therapies? No  Have you ever had an allergic reaction to: (This would include a severe allergic reaction or a reaction that caused hives, swelling, or respiratory distress, including wheezing.) A component of a COVID-19 vaccine or a previous dose of COVID-19 vaccine? No   Have you ever had an allergic reaction to another vaccine (other thanCOVID-19 vaccine) or an injectable medication? (This would include a severe allergic reaction or a reaction that caused hives, swelling, or respiratory distress, including wheezing.)   No    Do you have a history of any of the following:  Myocarditis or Pericarditis No  Dermal fillers:  No  Multisystem Inflammatory Syndrome (MIS-C or MIS-A)? No  COVID-19 disease within the past 3 months? No  Vaccinated with monkeypox vaccine in the last 4 weeks? No  Eligible, administered covid 19 vaccine, monitored, tolerated well. M.Ivery Nanney, LPN.    

## 2022-06-06 NOTE — Progress Notes (Unsigned)
   There were no vitals taken for this visit.   Subjective:    Patient ID: Logan Hartman, male    DOB: April 12, 1946, 76 y.o.   MRN: [id number]  HPI: Logan Hartman is a 76 y.o. male  No chief complaint on file.  Patient presents to clinic to establish care with new PCP.  Introduced to Publishing rights manager role and practice setting.  All questions answered.  Discussed provider/patient relationship and expectations.  Patient reports a history of ***. Patient denies a history of: Hypertension, Elevated Cholesterol, Diabetes, Thyroid problems, Depression, Anxiety, Neurological problems, and Abdominal problems.   Active Ambulatory Problems    Diagnosis Date Noted   Erectile dysfunction due to arterial insufficiency 11/12/2020   Gross hematuria 11/12/2020   Resolved Ambulatory Problems    Diagnosis Date Noted   No Resolved Ambulatory Problems   No Additional Past Medical History   No past surgical history on file. No family history on file.   Review of Systems  Per HPI unless specifically indicated above     Objective:    There were no vitals taken for this visit.  Wt Readings from Last 3 Encounters:  11/16/21 210 lb (95.3 kg)  11/12/20 215 lb (97.5 kg)  10/24/19 237 lb (107.5 kg)    Physical Exam  Results for orders placed or performed in visit on 11/12/20  Microscopic Examination   Urine  Result Value Ref Range   WBC, UA 0-5 0 - 5 /hpf   RBC, Urine None seen 0 - 2 /hpf   Epithelial Cells (non renal) 0-10 0 - 10 /hpf   Bacteria, UA None seen None seen/Few  Urinalysis, Complete  Result Value Ref Range   Specific Gravity, UA 1.010 1.005 - 1.030   pH, UA 6.5 5.0 - 7.5   Color, UA Yellow Yellow   Appearance Ur Hazy (A) Clear   Leukocytes,UA Negative Negative   Protein,UA Negative Negative/Trace   Glucose, UA Negative Negative   Ketones, UA Negative Negative   RBC, UA Negative Negative   Bilirubin, UA Negative Negative   Urobilinogen, Ur 1.0 0.2 - 1.0 mg/dL   Nitrite,  UA Negative Negative   Microscopic Examination See below:       Assessment & Plan:   Problem List Items Addressed This Visit   None    Follow up plan: No follow-ups on file.

## 2022-06-07 ENCOUNTER — Encounter: Payer: Self-pay | Admitting: Nurse Practitioner

## 2022-06-07 ENCOUNTER — Ambulatory Visit (INDEPENDENT_AMBULATORY_CARE_PROVIDER_SITE_OTHER): Payer: Medicare Other | Admitting: Nurse Practitioner

## 2022-06-07 VITALS — BP 130/74 | HR 56 | Temp 98.0°F | Ht 68.1 in | Wt 216.1 lb

## 2022-06-07 DIAGNOSIS — Z7689 Persons encountering health services in other specified circumstances: Secondary | ICD-10-CM | POA: Diagnosis not present

## 2022-06-07 DIAGNOSIS — E559 Vitamin D deficiency, unspecified: Secondary | ICD-10-CM | POA: Diagnosis not present

## 2022-06-07 DIAGNOSIS — F4310 Post-traumatic stress disorder, unspecified: Secondary | ICD-10-CM | POA: Insufficient documentation

## 2022-06-07 DIAGNOSIS — R7301 Impaired fasting glucose: Secondary | ICD-10-CM

## 2022-06-07 DIAGNOSIS — N5201 Erectile dysfunction due to arterial insufficiency: Secondary | ICD-10-CM | POA: Diagnosis not present

## 2022-06-07 DIAGNOSIS — E669 Obesity, unspecified: Secondary | ICD-10-CM | POA: Insufficient documentation

## 2022-06-07 MED ORDER — SILDENAFIL CITRATE 20 MG PO TABS: 20.0000 mg | ORAL_TABLET | Freq: Every day | ORAL | 3 refills | Status: DC

## 2022-06-07 NOTE — Assessment & Plan Note (Signed)
Followed by the [facility]. Will check labs at next visit.

## 2022-06-07 NOTE — Assessment & Plan Note (Signed)
Recommended eating smaller high protein, low fat meals more frequently and exercising 30 mins a day 5 times a week with a goal of 10-15lb weight loss in the next 3 months. Patient voiced their understanding and motivation to adhere to these recommendations.  

## 2022-06-07 NOTE — Assessment & Plan Note (Signed)
Controlled.  Followed by the [facility].  Will check labs at next visit.

## 2022-06-07 NOTE — Assessment & Plan Note (Signed)
Chronic.  Controlled.  Continue with current medication regimen of Sildenafil.  Labs ordered today.  Return to clinic in 6 months for reevaluation.  Call sooner if concerns arise.

## 2022-10-10 ENCOUNTER — Telehealth: Payer: Self-pay | Admitting: Nurse Practitioner

## 2022-10-10 NOTE — Telephone Encounter (Signed)
Contacted Logan Hartman to schedule their annual wellness visit. Appointment made for 10/24/2022.  Verlee Rossetti; Care Guide Ambulatory Clinical Support [place] [facility] Direct Dial: [phone]

## 2022-10-23 ENCOUNTER — Ambulatory Visit (INDEPENDENT_AMBULATORY_CARE_PROVIDER_SITE_OTHER): Payer: 59 | Admitting: Urology

## 2022-10-23 ENCOUNTER — Encounter: Payer: Self-pay | Admitting: Urology

## 2022-10-23 VITALS — BP 172/65 | HR 59 | Ht 68.1 in | Wt 208.0 lb

## 2022-10-23 DIAGNOSIS — N5201 Erectile dysfunction due to arterial insufficiency: Secondary | ICD-10-CM | POA: Diagnosis not present

## 2022-10-23 MED ORDER — SILDENAFIL CITRATE 20 MG PO TABS: 20.0000 mg | ORAL_TABLET | Freq: Every day | ORAL | 3 refills | Status: DC

## 2022-10-23 NOTE — Progress Notes (Signed)
   I, Logan Hartman, acting as a Neurosurgeon for Riki Altes, MD., have documented all relevant documentation on the behalf of Riki Altes, MD, as directed by  Riki Altes, MD while in the presence of Riki Altes, MD.   10/23/2022 10:25 AM   Logan Hartman 09-Feb-1946 [id number]  Referring provider: Larae Grooms, NP [address] [place],  [place] [address]  Chief Complaint  Patient presents with   Erectile Dysfunction    Urologic history: 1.  Gross hematuria June 2020: CTU unremarkable; cystoscopy BPH with prominent vasculature/friability  2.  Erectile dysfunction Sildenafil prn  HPI: 77 y.o. male presents for annual follow-up.    Doing well since last visit No bothersome LUTS Denies dysuria, recurrent gross hematuria Denies flank, abdominal or pelvic pain He has taken up to 80 mg Sildenafil, which has been more effective than when he takes typically takes only 2-3 He was interested in the 100 mg dose of Sildenafil  PMH: Past Medical History:  Diagnosis Date   Allergy 1999   Dust, pollen, etc   Anxiety Most of my life   Family & War   Arthritis As I got older   Working in Sales promotion account executive gun   Cataract 1998   Had it corrected in [place]    Surgical History: Past Surgical History:  Procedure Laterality Date   EYE SURGERY  1998   The cataract correction    Home Medications:  Allergies as of 10/23/2022   No Known Allergies      Medication List        Accurate as of October 23, 2022 10:25 AM. If you have any questions, ask your nurse or doctor.          sildenafil 20 MG tablet Commonly known as: REVATIO Take 1 tablet (20 mg total) by mouth daily. Take 1-5 tablets as needed        Family History: Family History  Problem Relation Age of Onset   Hearing loss Mother    Varicose Veins Mother    Alcohol abuse Father    Arthritis Father    Cancer Father    Anxiety disorder Brother     Social History:  reports that he quit  smoking about 49 years ago. His smoking use included cigarettes. He has a 30.00 pack-year smoking history. He has never used smokeless tobacco. He reports current alcohol use of about 1.0 standard drink of alcohol per week. He reports that he does not use drugs.   Physical Exam: BP (!) 172/65   Pulse (!) 59   Ht 5' 8.1" (1.73 m)   Wt 208 lb (94.3 kg)   BMI 31.53 kg/m   Constitutional:  Alert and oriented, No acute distress. HEENT: [place] AT, moist mucus membranes.  Trachea midline, no masses. Cardiovascular: No clubbing, cyanosis, or edema. Respiratory: Normal respiratory effort, no increased work of breathing.   Assessment & Plan:    1.  Erectile dysfunction Stable. RX Sildenafil 100 mg sent to pharmacy. Continue annual follow up.  I have reviewed the above documentation for accuracy and completeness, and I agree with the above.   Riki Altes, MD  [facility] [address], [address] [place], [place] [address] [phone]

## 2022-10-24 ENCOUNTER — Ambulatory Visit (INDEPENDENT_AMBULATORY_CARE_PROVIDER_SITE_OTHER): Payer: 59

## 2022-10-24 VITALS — Ht 68.0 in | Wt 208.0 lb

## 2022-10-24 DIAGNOSIS — Z0000 Encounter for general adult medical examination without abnormal findings: Secondary | ICD-10-CM

## 2022-10-24 NOTE — Patient Instructions (Signed)
Logan Hartman , Thank you for taking time to come for your Medicare Wellness Visit. I appreciate your ongoing commitment to your health goals. Please review the following plan we discussed and let me know if I can assist you in the future.   These are the goals we discussed:  Goals      DIET - EAT MORE FRUITS AND VEGETABLES        This is a list of the screening recommended for you and due dates:  Health Maintenance  Topic Date Due   Hepatitis C Screening: USPSTF Recommendation to screen - Ages 7-79 yo.  Never done   Colon Cancer Screening  Never done   Zoster (Shingles) Vaccine (1 of 2) Never done   Flu Shot  01/25/2023   DTaP/Tdap/Td vaccine (3 - Td or Tdap) 06/22/2027   Pneumonia Vaccine  Completed   COVID-19 Vaccine  Completed   HPV Vaccine  Aged Out    Advanced directives: no   Conditions/risks identified: none  Next appointment: Follow up in one year for your annual wellness visit. 10/30/23 @ 2:00 pm by phone  Preventive Care 65 Years and Older, Male  Preventive care refers to lifestyle choices and visits with your health care provider that can promote health and wellness. What does preventive care include? A yearly physical exam. This is also called an annual well check. Dental exams once or twice a year. Routine eye exams. Ask your health care provider how often you should have your eyes checked. Personal lifestyle choices, including: Daily care of your teeth and gums. Regular physical activity. Eating a healthy diet. Avoiding tobacco and drug use. Limiting alcohol use. Practicing safe sex. Taking low doses of aspirin every day. Taking vitamin and mineral supplements as recommended by your health care provider. What happens during an annual well check? The services and screenings done by your health care provider during your annual well check will depend on your age, overall health, lifestyle risk factors, and family history of disease. Counseling  Your health care  provider may ask you questions about your: Alcohol use. Tobacco use. Drug use. Emotional well-being. Home and relationship well-being. Sexual activity. Eating habits. History of falls. Memory and ability to understand (cognition). Work and work Astronomer. Screening  You may have the following tests or measurements: Height, weight, and BMI. Blood pressure. Lipid and cholesterol levels. These may be checked every 5 years, or more frequently if you are over 39 years old. Skin check. Lung cancer screening. You may have this screening every year starting at age 38 if you have a 30-pack-year history of smoking and currently smoke or have quit within the past 15 years. Fecal occult blood test (FOBT) of the stool. You may have this test every year starting at age 48. Flexible sigmoidoscopy or colonoscopy. You may have a sigmoidoscopy every 5 years or a colonoscopy every 10 years starting at age 39. Prostate cancer screening. Recommendations will vary depending on your family history and other risks. Hepatitis C blood test. Hepatitis B blood test. Sexually transmitted disease (STD) testing. Diabetes screening. This is done by checking your blood sugar (glucose) after you have not eaten for a while (fasting). You may have this done every 1-3 years. Abdominal aortic aneurysm (AAA) screening. You may need this if you are a current or former smoker. Osteoporosis. You may be screened starting at age 56 if you are at high risk. Talk with your health care provider about your test results, treatment options, and if  necessary, the need for more tests. Vaccines  Your health care provider may recommend certain vaccines, such as: Influenza vaccine. This is recommended every year. Tetanus, diphtheria, and acellular pertussis (Tdap, Td) vaccine. You may need a Td booster every 10 years. Zoster vaccine. You may need this after age 23. Pneumococcal 13-valent conjugate (PCV13) vaccine. One dose is  recommended after age 68. Pneumococcal polysaccharide (PPSV23) vaccine. One dose is recommended after age 42. Talk to your health care provider about which screenings and vaccines you need and how often you need them. This information is not intended to replace advice given to you by your health care provider. Make sure you discuss any questions you have with your health care provider. Document Released: 07/09/2015 Document Revised: 03/01/2016 Document Reviewed: 04/13/2015 Elsevier Interactive Patient Education  2017 [employer] Prevention in the Home Falls can cause injuries. They can happen to people of all ages. There are many things you can do to make your home safe and to help prevent falls. What can I do on the outside of my home? Regularly fix the edges of walkways and driveways and fix any cracks. Remove anything that might make you trip as you walk through a door, such as a raised step or threshold. Trim any bushes or trees on the path to your home. Use bright outdoor lighting. Clear any walking paths of anything that might make someone trip, such as rocks or tools. Regularly check to see if handrails are loose or broken. Make sure that both sides of any steps have handrails. Any raised decks and porches should have guardrails on the edges. Have any leaves, snow, or ice cleared regularly. Use sand or salt on walking paths during winter. Clean up any spills in your garage right away. This includes oil or grease spills. What can I do in the bathroom? Use night lights. Install grab bars by the toilet and in the tub and shower. Do not use towel bars as grab bars. Use non-skid mats or decals in the tub or shower. If you need to sit down in the shower, use a plastic, non-slip stool. Keep the floor dry. Clean up any water that spills on the floor as soon as it happens. Remove soap buildup in the tub or shower regularly. Attach bath mats securely with double-sided non-slip rug  tape. Do not have throw rugs and other things on the floor that can make you trip. What can I do in the bedroom? Use night lights. Make sure that you have a light by your bed that is easy to reach. Do not use any sheets or blankets that are too big for your bed. They should not hang down onto the floor. Have a firm chair that has side arms. You can use this for support while you get dressed. Do not have throw rugs and other things on the floor that can make you trip. What can I do in the kitchen? Clean up any spills right away. Avoid walking on wet floors. Keep items that you use a lot in easy-to-reach places. If you need to reach something above you, use a strong step stool that has a grab bar. Keep electrical cords out of the way. Do not use floor polish or wax that makes floors slippery. If you must use wax, use non-skid floor wax. Do not have throw rugs and other things on the floor that can make you trip. What can I do with my stairs? Do not leave any items  on the stairs. Make sure that there are handrails on both sides of the stairs and use them. Fix handrails that are broken or loose. Make sure that handrails are as long as the stairways. Check any carpeting to make sure that it is firmly attached to the stairs. Fix any carpet that is loose or worn. Avoid having throw rugs at the top or bottom of the stairs. If you do have throw rugs, attach them to the floor with carpet tape. Make sure that you have a light switch at the top of the stairs and the bottom of the stairs. If you do not have them, ask someone to add them for you. What else can I do to help prevent falls? Wear shoes that: Do not have high heels. Have rubber bottoms. Are comfortable and fit you well. Are closed at the toe. Do not wear sandals. If you use a stepladder: Make sure that it is fully opened. Do not climb a closed stepladder. Make sure that both sides of the stepladder are locked into place. Ask someone to  hold it for you, if possible. Clearly mark and make sure that you can see: Any grab bars or handrails. First and last steps. Where the edge of each step is. Use tools that help you move around (mobility aids) if they are needed. These include: Canes. Walkers. Scooters. Crutches. Turn on the lights when you go into a dark area. Replace any light bulbs as soon as they burn out. Set up your furniture so you have a clear path. Avoid moving your furniture around. If any of your floors are uneven, fix them. If there are any pets around you, be aware of where they are. Review your medicines with your doctor. Some medicines can make you feel dizzy. This can increase your chance of falling. Ask your doctor what other things that you can do to help prevent falls. This information is not intended to replace advice given to you by your health care provider. Make sure you discuss any questions you have with your health care provider. Document Released: 04/08/2009 Document Revised: 11/18/2015 Document Reviewed: 07/17/2014 Elsevier Interactive Patient Education  2017 [employer].

## 2022-10-24 NOTE — Progress Notes (Signed)
I connected with  Logan Hartman on 10/24/22 by a audio enabled telemedicine application and verified that I am speaking with the correct person using two identifiers.  Patient Location: Home  Provider Location: Office/Clinic  I discussed the limitations of evaluation and management by telemedicine. The patient expressed understanding and agreed to proceed.  Subjective:   Logan Hartman is a 77 y.o. male who presents for Medicare Annual/Subsequent preventive examination.  Review of Systems     Cardiac Risk Factors include: advanced age (>46men, >75 women)     Objective:    There were no vitals filed for this visit. There is no height or weight on file to calculate BMI.     10/24/2022    3:46 PM  Advanced Directives  Does Patient Have a Medical Advance Directive? No  Would patient like information on creating a medical advance directive? No - Patient declined    Current Medications (verified) Outpatient Encounter Medications as of 10/24/2022  Medication Sig   sildenafil (REVATIO) 20 MG tablet Take 1 tablet (20 mg total) by mouth daily. Take 1-5 tablets as needed   No facility-administered encounter medications on file as of 10/24/2022.    Allergies (verified) Patient has no known allergies.   History: Past Medical History:  Diagnosis Date   Allergy 1999   Dust, pollen, etc   Anxiety Most of my life   Family & War   Arthritis As I got older   Working in Sales promotion account executive gun   Cataract 1998   Had it corrected in [place]   Past Surgical History:  Procedure Laterality Date   EYE SURGERY  1998   The cataract correction   Family History  Problem Relation Age of Onset   Hearing loss Mother    Varicose Veins Mother    Alcohol abuse Father    Arthritis Father    Cancer Father    Anxiety disorder Brother    Social History   Socioeconomic History   Marital status: Married    Spouse name: Not on file   Number of children: Not on file   Years of education:  Not on file   Highest education level: Not on file  Occupational History   Not on file  Tobacco Use   Smoking status: Former    Packs/day: 2.00    Years: 15.00    Additional pack years: 0.00    Total pack years: 30.00    Types: Cigarettes    Quit date: 10/03/1973    Years since quitting: 49.0   Smokeless tobacco: Never  Vaping Use   Vaping Use: Never used  Substance and Sexual Activity   Alcohol use: Yes    Alcohol/week: 1.0 standard drink of alcohol    Types: 1 Standard drinks or equivalent per week   Drug use: Never   Sexual activity: Yes    Birth control/protection: Surgical  Other Topics Concern   Not on file  Social History Narrative   Not on file   Social Determinants of Health   Financial Resource Strain: Low Risk  (10/24/2022)   Overall Financial Resource Strain (CARDIA)    Difficulty of Paying Living Expenses: Not hard at all  Food Insecurity: No Food Insecurity (10/24/2022)   Hunger Vital Sign    Worried About Running Out of Food in the Last Year: Never true    Ran Out of Food in the Last Year: Never true  Transportation Needs: No Transportation Needs (10/24/2022)   PRAPARE - Transportation  Lack of Transportation (Medical): No    Lack of Transportation (Non-Medical): No  Physical Activity: Insufficiently Active (10/24/2022)   Exercise Vital Sign    Days of Exercise per Week: 3 days    Minutes of Exercise per Session: 40 min  Stress: No Stress Concern Present (10/24/2022)   [employer] of Occupational Health - Occupational Stress Questionnaire    Feeling of Stress : Not at all  Social Connections: Moderately Integrated (10/24/2022)   Social Connection and Isolation Panel [NHANES]    Frequency of Communication with Friends and Family: More than three times a week    Frequency of Social Gatherings with Friends and Family: Twice a week    Attends Religious Services: More than 4 times per year    Active Member of [employer] or Organizations: No    Attends Probation officer: Never    Marital Status: Married    Tobacco Counseling Counseling given: Not Answered   Clinical Intake:  Pre-visit preparation completed: Yes  Pain : No/denies pain     Nutritional Risks: None Diabetes: No  How often do you need to have someone help you when you read instructions, pamphlets, or other written materials from your doctor or pharmacy?: 1 - Never  Diabetic?no  Interpreter Needed?: No  Information entered by :: Kennedy Bucker, LPN   Activities of Daily Living    10/24/2022    3:47 PM 10/20/2022    6:59 PM  In your present state of health, do you have any difficulty performing the following activities:  Hearing? 0 0  Vision? 0 0  Difficulty concentrating or making decisions? 0 0  Walking or climbing stairs? 0 0  Dressing or bathing? 0 0  Doing errands, shopping? 0 0  Preparing Food and eating ? N N  Using the Toilet? N N  In the past six months, have you accidently leaked urine? N N  Do you have problems with loss of bowel control? N N  Managing your Medications? N N  Managing your Finances? N N  Housekeeping or managing your Housekeeping? N N    Patient Care Team: Larae Grooms, NP as PCP - General (Nurse Practitioner)  Indicate any recent Medical Services you may have received from other than [facility] providers in the past year (date may be approximate).     Assessment:   This is a routine wellness examination for Logan Hartman.  Hearing/Vision screen Hearing Screening - Comments:: No aids Vision Screening - Comments:: Wears bifocals- [facility] Dr.Woodard  Dietary issues and exercise activities discussed: Current Exercise Habits: Home exercise routine, Type of exercise: walking, Time (Minutes): 40, Frequency (Times/Week): 5, Weekly Exercise (Minutes/Week): 200, Intensity: Mild   Goals Addressed             This Visit's Progress    DIET - EAT MORE FRUITS AND VEGETABLES         Depression Screen    10/24/2022     3:44 PM 06/07/2022    1:13 PM  PHQ 2/9 Scores  PHQ - 2 Score 0 0  PHQ- 9 Score 0 2    Fall Risk    10/24/2022    3:47 PM 10/20/2022    6:59 PM 06/07/2022    1:13 PM  Fall Risk   Falls in the past year? 0 0 0  Number falls in past yr: 0 0 0  Injury with Fall? 0 0 0  Risk for fall due to : No Fall Risks  No Fall Risks  Follow up Falls prevention discussed;Falls evaluation completed  Falls evaluation completed    FALL RISK PREVENTION PERTAINING TO THE HOME:  Any stairs in or around the home? Yes  If so, are there any without handrails? No  Home free of loose throw rugs in walkways, pet beds, electrical cords, etc? Yes  Adequate lighting in your home to reduce risk of falls? Yes   ASSISTIVE DEVICES UTILIZED TO PREVENT FALLS:  Life alert? No  Use of a cane, walker or w/c? No  Grab bars in the bathroom? No  Shower chair or bench in shower? Yes  Elevated toilet seat or a handicapped toilet? No    Cognitive Function:        10/24/2022    3:51 PM  6CIT Screen  What Year? 0 points  What month? 0 points  What time? 3 points  Count back from 20 0 points  Months in reverse 4 points  Repeat phrase 4 points  Total Score 11 points    Immunizations Immunization History  Administered Date(s) Administered   COVID-19, mRNA, vaccine(Comirnaty)12 years and older 05/12/2022   Fluad Quad(high Dose 65+) 06/13/2021   Influenza, High Dose Seasonal PF 04/21/2022   PFIZER(Purple Top)SARS-COV-2 Vaccination 08/12/2019, 09/02/2019   PNEUMOCOCCAL CONJUGATE-20 06/13/2021   Td (Adult) 06/21/2017   Tdap 11/30/2006    TDAP status: Up to date  Flu Vaccine status: Up to date  Pneumococcal vaccine status: Up to date  Covid-19 vaccine status: Completed vaccines  Qualifies for Shingles Vaccine? Yes   Zostavax completed No   Shingrix Completed?: No.    Education has been provided regarding the importance of this vaccine. Patient has been advised to call insurance company to determine out  of pocket expense if they have not yet received this vaccine. Advised may also receive vaccine at local pharmacy or Health Dept. Verbalized acceptance and understanding.  Screening Tests Health Maintenance  Topic Date Due   Hepatitis C Screening  Never done   COLONOSCOPY (Pts 45-71yrs Insurance coverage will need to be confirmed)  Never done   Zoster Vaccines- Shingrix (1 of 2) Never done   INFLUENZA VACCINE  01/25/2023   DTaP/Tdap/Td (3 - Td or Tdap) 06/22/2027   Pneumonia Vaccine 1+ Years old  Completed   COVID-19 Vaccine  Completed   HPV VACCINES  Aged Out    Health Maintenance  Health Maintenance Due  Topic Date Due   Hepatitis C Screening  Never done   COLONOSCOPY (Pts 45-6yrs Insurance coverage will need to be confirmed)  Never done   Zoster Vaccines- Shingrix (1 of 2) Never done    Colorectal cancer screening: No longer required. - had one in December at the [facility].  Lung Cancer Screening: (Low Dose CT Chest recommended if Age 27-80 years, 30 pack-year currently smoking OR have quit w/in 15years.) does not qualify.    Additional Screening:  Hepatitis C Screening: does qualify; Completed no  Vision Screening: Recommended annual ophthalmology exams for early detection of glaucoma and other disorders of the eye. Is the patient up to date with their annual eye exam?  Yes  Who is the provider or what is the name of the office in which the patient attends annual eye exams? [facility] Dr. Clydene Pugh If pt is not established with a provider, would they like to be referred to a provider to establish care? No .   Dental Screening: Recommended annual dental exams for proper oral hygiene  Community Resource Referral / Chronic Care Management: CRR required this  visit?  No   CCM required this visit?  No      Plan:     I have personally reviewed and noted the following in the patient's chart:   Medical and social history Use of alcohol, tobacco or illicit drugs  Current  medications and supplements including opioid prescriptions. Patient is not currently taking opioid prescriptions. Functional ability and status Nutritional status Physical activity Advanced directives List of other physicians Hospitalizations, surgeries, and ER visits in previous 12 months Vitals Screenings to include cognitive, depression, and falls Referrals and appointments  In addition, I have reviewed and discussed with patient certain preventive protocols, quality metrics, and best practice recommendations. A written personalized care plan for preventive services as well as general preventive health recommendations were provided to patient.     Hal Hope, LPN   12/20/9483   Nurse Notes: none

## 2022-10-25 MED ORDER — SILDENAFIL CITRATE 20 MG PO TABS: ORAL_TABLET | ORAL | 3 refills | Status: DC

## 2022-10-25 NOTE — Addendum Note (Signed)
Addended by: Consuella Lose on: 10/25/2022 10:39 AM   Modules accepted: Orders

## 2022-10-27 ENCOUNTER — Telehealth: Payer: Self-pay | Admitting: *Deleted

## 2022-10-27 NOTE — Telephone Encounter (Signed)
Pt calling asking for prior auth for Sildenafil 100mg  . I tried explaining that this mediation is cash pay and to use Goodrx. Pt states he spoke with [facility] and they will pay for medication.

## 2022-11-17 ENCOUNTER — Ambulatory Visit: Payer: No Typology Code available for payment source | Admitting: Urology

## 2023-02-21 DIAGNOSIS — H33192 Other retinoschisis and retinal cysts, left eye: Secondary | ICD-10-CM | POA: Diagnosis not present

## 2023-02-21 DIAGNOSIS — Z961 Presence of intraocular lens: Secondary | ICD-10-CM | POA: Diagnosis not present

## 2023-02-21 DIAGNOSIS — H353132 Nonexudative age-related macular degeneration, bilateral, intermediate dry stage: Secondary | ICD-10-CM | POA: Diagnosis not present

## 2023-02-21 DIAGNOSIS — H25812 Combined forms of age-related cataract, left eye: Secondary | ICD-10-CM | POA: Diagnosis not present

## 2023-03-08 ENCOUNTER — Ambulatory Visit: Payer: 59

## 2023-03-08 DIAGNOSIS — Z23 Encounter for immunization: Secondary | ICD-10-CM | POA: Diagnosis not present

## 2023-03-08 DIAGNOSIS — Z719 Counseling, unspecified: Secondary | ICD-10-CM

## 2023-03-08 NOTE — Progress Notes (Signed)
Pt seen in nurse clinic requested Pfizer vaccine. Eligible per [facility]. Administered Pfizer Comirnaty 12y+ to (R) deltoid, monitored pt for 15 minutes without problems. Given VIS and [facility] copy, explained and understood. M.Alaija Ruble, LPN.

## 2023-03-15 ENCOUNTER — Ambulatory Visit (INDEPENDENT_AMBULATORY_CARE_PROVIDER_SITE_OTHER): Payer: 59

## 2023-03-15 DIAGNOSIS — Z23 Encounter for immunization: Secondary | ICD-10-CM | POA: Diagnosis not present

## 2023-04-05 ENCOUNTER — Ambulatory Visit: Payer: Self-pay | Admitting: *Deleted

## 2023-04-05 ENCOUNTER — Ambulatory Visit (INDEPENDENT_AMBULATORY_CARE_PROVIDER_SITE_OTHER): Payer: 59 | Admitting: Family Medicine

## 2023-04-05 VITALS — BP 154/72 | HR 45 | Ht 68.0 in | Wt 211.6 lb

## 2023-04-05 DIAGNOSIS — L03115 Cellulitis of right lower limb: Secondary | ICD-10-CM | POA: Diagnosis not present

## 2023-04-05 MED ORDER — SULFAMETHOXAZOLE-TRIMETHOPRIM 800-160 MG PO TABS: 1.0000 | ORAL_TABLET | Freq: Two times a day (BID) | ORAL | 0 refills | Status: DC

## 2023-04-05 NOTE — Telephone Encounter (Signed)
  Chief Complaint: leg swelling Symptoms: right leg swelling- edema, weeping, redness, burning pain Frequency: started week Pertinent Negatives: Patient denies fever Disposition: [] ED /[] Urgent Care (no appt availability in office) / [x] Appointment(In office/virtual)/ []  [facility]/ [] Home Care/ [] Refused Recommended Disposition /[]  Mobile Bus/ []  Follow-up with PCP Additional Notes: Appointment has been scheduled for evaluation- concern is cellulitis hx

## 2023-04-05 NOTE — Progress Notes (Signed)
BP (!) 154/72   Pulse (!) 45   Ht 5\' 8"  (1.727 m)   Wt 211 lb 9.6 oz (96 kg)   SpO2 100%   BMI 32.17 kg/m    Subjective:    Patient ID: Logan Hartman, male    DOB: [birth year], 77 y.o.   MRN: [id number]  HPI: Logan Hartman is a 77 y.o. male  Chief Complaint  Patient presents with   Leg Swelling    Patient says he has noticed a flare up on his lower R left and says the issues at hand has been going on off and on. Patient says this issues has happened before, but says it was almost 20 years ago. Patient says he has been using a cream but says it is only for dry skin.   Leg Pain   Cellulitis   SKIN INFECTION Duration: about a week Location: R lower leg History of trauma in area: yes Pain: no Quality: no Severity: moderate Redness: yes Swelling: yes Oozing: yes Pus: no Fevers: no Nausea/vomiting: no Status: worse Treatments attempted:none  Tetanus: UTD  Relevant past medical, surgical, family and social history reviewed and updated as indicated. Interim medical history since our last visit reviewed. Allergies and medications reviewed and updated.  Review of Systems  Constitutional: Negative.   Respiratory: Negative.    Cardiovascular:  Positive for leg swelling. Negative for chest pain and palpitations.  Skin:  Positive for color change. Negative for pallor, rash and wound.  Psychiatric/Behavioral: Negative.      Per HPI unless specifically indicated above     Objective:    BP (!) 154/72   Pulse (!) 45   Ht 5\' 8"  (1.727 m)   Wt 211 lb 9.6 oz (96 kg)   SpO2 100%   BMI 32.17 kg/m   Wt Readings from Last 3 Encounters:  04/05/23 211 lb 9.6 oz (96 kg)  10/24/22 208 lb (94.3 kg)  10/23/22 208 lb (94.3 kg)    Physical Exam Vitals and nursing note reviewed.  Constitutional:      General: He is not in acute distress.    Appearance: Normal appearance. He is not ill-appearing, toxic-appearing or diaphoretic.  HENT:     Head: Normocephalic and atraumatic.      Right Ear: External ear normal.     Left Ear: External ear normal.     Nose: Nose normal.     Mouth/Throat:     Mouth: Mucous membranes are moist.     Pharynx: Oropharynx is clear.  Eyes:     General: No scleral icterus.       Right eye: No discharge.        Left eye: No discharge.     Extraocular Movements: Extraocular movements intact.     Conjunctiva/sclera: Conjunctivae normal.     Pupils: Pupils are equal, round, and reactive to light.  Cardiovascular:     Rate and Rhythm: Normal rate and regular rhythm.     Pulses: Normal pulses.     Heart sounds: Normal heart sounds. No murmur heard.    No friction rub. No gallop.  Pulmonary:     Effort: Pulmonary effort is normal. No respiratory distress.     Breath sounds: Normal breath sounds. No stridor. No wheezing, rhonchi or rales.  Chest:     Chest wall: No tenderness.  Musculoskeletal:        General: No swelling, tenderness, deformity or signs of injury. Normal range of motion.     Cervical  back: Normal range of motion and neck supple.     Right lower leg: No edema.     Left lower leg: Edema present.  Skin:    General: Skin is warm and dry.     Capillary Refill: Capillary refill takes less than 2 seconds.     Coloration: Skin is not jaundiced or pale.     Findings: No bruising, erythema, lesion or rash.  Neurological:     General: No focal deficit present.     Mental Status: He is alert and oriented to person, place, and time. Mental status is at baseline.  Psychiatric:        Mood and Affect: Mood normal.        Behavior: Behavior normal.        Thought Content: Thought content normal.        Judgment: Judgment normal.     Results for orders placed or performed in visit on 11/12/20  Microscopic Examination   Urine  Result Value Ref Range   WBC, UA 0-5 0 - 5 /hpf   RBC, Urine None seen 0 - 2 /hpf   Epithelial Cells (non renal) 0-10 0 - 10 /hpf   Bacteria, UA None seen None seen/Few  Urinalysis, Complete  Result  Value Ref Range   Specific Gravity, UA 1.010 1.005 - 1.030   pH, UA 6.5 5.0 - 7.5   Color, UA Yellow Yellow   Appearance Ur Hazy (A) Clear   Leukocytes,UA Negative Negative   Protein,UA Negative Negative/Trace   Glucose, UA Negative Negative   Ketones, UA Negative Negative   RBC, UA Negative Negative   Bilirubin, UA Negative Negative   Urobilinogen, Ur 1.0 0.2 - 1.0 mg/dL   Nitrite, UA Negative Negative   Microscopic Examination See below:       Assessment & Plan:   Problem List Items Addressed This Visit   None Visit Diagnoses     Cellulitis of right leg    -  Primary   Will treat with bactrim. Recheck in 1 week. Call with any concerns.        Follow up plan: Return in about 1 week (around 04/12/2023).

## 2023-04-05 NOTE — Telephone Encounter (Signed)
Reason for Disposition  [1] Red area or streak [2] large (> 2 in. or 5 cm)  Answer Assessment - Initial Assessment Questions 1. ONSET: "When did the swelling start?" (e.g., minutes, hours, days)     1 week- varicose vein hx 2. LOCATION: "What part of the leg is swollen?"  "Are both legs swollen or just one leg?"     Right leg-ankle up- 6 inches up 3. SEVERITY: "How bad is the swelling?" (e.g., localized; mild, moderate, severe)   - Localized: Small area of swelling localized to one leg.   - MILD pedal edema: Swelling limited to foot and ankle, pitting edema < 1/4 inch (6 mm) deep, rest and elevation eliminate most or all swelling.   - MODERATE edema: Swelling of lower leg to knee, pitting edema > 1/4 inch (6 mm) deep, rest and elevation only partially reduce swelling.   - SEVERE edema: Swelling extends above knee, facial or hand swelling present.      Moderate/severe 4. REDNESS: "Does the swelling look red or infected?"     Yes-weeping, some bleeding 5. PAIN: "Is the swelling painful to touch?" If Yes, ask: "How painful is it?"   (Scale 1-10; mild, moderate or severe)     Burning sensation 6. FEVER: "Do you have a fever?" If Yes, ask: "What is it, how was it measured, and when did it start?"      no 7. CAUSE: "What do you think is causing the leg swelling?"     Hx edema in leg 8. MEDICAL HISTORY: "Do you have a history of blood clots (e.g., DVT), cancer, heart failure, kidney disease, or liver failure?"     Varicose vein 9. RECURRENT SYMPTOM: "Have you had leg swelling before?" If Yes, ask: "When was the last time?" "What happened that time?"     Yes- worried about cellulitis 10. OTHER SYMPTOMS: "Do you have any other symptoms?" (e.g., chest pain, difficulty breathing)       no  Protocols used: Leg Swelling and Edema-A-AH

## 2023-04-12 ENCOUNTER — Telehealth: Payer: Self-pay | Admitting: Nurse Practitioner

## 2023-04-12 NOTE — Telephone Encounter (Signed)
Pt's daughter is calling in because pt currently has COVID and he has an appointment scheduled for tomorrow. The appointment is to follow up on antibiotics and pt isn't sure if he is supposed to have labs done. Pt says he is able to do a MyChart visit, but wanted to check with the provider first in case labs were needed. Please follow up with pt.

## 2023-04-13 ENCOUNTER — Telehealth (INDEPENDENT_AMBULATORY_CARE_PROVIDER_SITE_OTHER): Payer: 59 | Admitting: Family Medicine

## 2023-04-13 ENCOUNTER — Encounter: Payer: Self-pay | Admitting: Family Medicine

## 2023-04-13 DIAGNOSIS — L03115 Cellulitis of right lower limb: Secondary | ICD-10-CM | POA: Diagnosis not present

## 2023-04-13 DIAGNOSIS — U071 COVID-19: Secondary | ICD-10-CM | POA: Diagnosis not present

## 2023-04-13 NOTE — Progress Notes (Signed)
Appointment has been made.

## 2023-04-13 NOTE — Progress Notes (Signed)
There were no vitals taken for this visit.   Subjective:    Patient ID: Logan Hartman, male    DOB: March 25, 1946, 77 y.o.   MRN: [id number]  HPI: Logan Hartman is a 77 y.o. male  Chief Complaint  Patient presents with   Cellulitis    Patient says he thinks the area of concern is cleared. Patient says he has completed his course of treatment. Patient wife says still a small area of leg. Patient declines any pain or weeping in the area.    Covid Positive    Patient says he tested positive for COVID yesterday. Patient says his symptoms started with a cough and irritation of his throat. Patient says he started having symptoms last Friday. Patient says he was really tired and sleeping a lot.    UPPER RESPIRATORY TRACT INFECTION Duration: about a week Worst symptom: cough and sore throat Fever: yes Cough: yes Shortness of breath: yes- very mild Wheezing: no Chest pain: no Chest tightness: no Chest congestion: no Nasal congestion: no Runny nose: yes Post nasal drip: no Sneezing: no Sore throat: no Swollen glands: no Sinus pressure: yes Headache: no Face pain: no Toothache: no Ear pain: no  Ear pressure: no  Eyes red/itching:no Eye drainage/crusting: no  Vomiting: no Rash: no Fatigue: yes Sick contacts: yes Strep contacts: no  Context: better Recurrent sinusitis: no Relief with OTC cold/cough medications: no  Treatments attempted: bactrim   Cellulitis has resolved. Not red or painful or hot anymore.   Relevant past medical, surgical, family and social history reviewed and updated as indicated. Interim medical history since our last visit reviewed. Allergies and medications reviewed and updated.  Review of Systems  Constitutional: Negative.   HENT: Negative.    Respiratory: Negative.    Cardiovascular: Negative.   Gastrointestinal: Negative.   Musculoskeletal: Negative.   Psychiatric/Behavioral: Negative.      Per HPI unless specifically indicated above      Objective:    There were no vitals taken for this visit.  Wt Readings from Last 3 Encounters:  04/05/23 211 lb 9.6 oz (96 kg)  10/24/22 208 lb (94.3 kg)  10/23/22 208 lb (94.3 kg)    Physical Exam Vitals and nursing note reviewed.  Constitutional:      General: He is not in acute distress.    Appearance: Normal appearance. He is not ill-appearing, toxic-appearing or diaphoretic.  HENT:     Head: Normocephalic and atraumatic.     Right Ear: External ear normal.     Left Ear: External ear normal.     Nose: Nose normal.     Mouth/Throat:     Mouth: Mucous membranes are moist.     Pharynx: Oropharynx is clear.  Eyes:     General: No scleral icterus.       Right eye: No discharge.        Left eye: No discharge.     Conjunctiva/sclera: Conjunctivae normal.     Pupils: Pupils are equal, round, and reactive to light.  Pulmonary:     Effort: Pulmonary effort is normal. No respiratory distress.     Comments: Speaking in full sentences Musculoskeletal:        General: Normal range of motion.     Cervical back: Normal range of motion.  Skin:    Coloration: Skin is not jaundiced or pale.     Findings: No bruising, erythema, lesion or rash.  Neurological:     Mental Status: He is alert and  oriented to person, place, and time. Mental status is at baseline.  Psychiatric:        Mood and Affect: Mood normal.        Behavior: Behavior normal.        Thought Content: Thought content normal.        Judgment: Judgment normal.     Results for orders placed or performed in visit on 11/12/20  Microscopic Examination   Urine  Result Value Ref Range   WBC, UA 0-5 0 - 5 /hpf   RBC, Urine None seen 0 - 2 /hpf   Epithelial Cells (non renal) 0-10 0 - 10 /hpf   Bacteria, UA None seen None seen/Few  Urinalysis, Complete  Result Value Ref Range   Specific Gravity, UA 1.010 1.005 - 1.030   pH, UA 6.5 5.0 - 7.5   Color, UA Yellow Yellow   Appearance Ur Hazy (A) Clear   Leukocytes,UA  Negative Negative   Protein,UA Negative Negative/Trace   Glucose, UA Negative Negative   Ketones, UA Negative Negative   RBC, UA Negative Negative   Bilirubin, UA Negative Negative   Urobilinogen, Ur 1.0 0.2 - 1.0 mg/dL   Nitrite, UA Negative Negative   Microscopic Examination See below:       Assessment & Plan:   Problem List Items Addressed This Visit   None Visit Diagnoses     COVID    -  Primary   Outside the window for treatment and feeling better. Call with any concerns or if getting worse.   Cellulitis of right leg       Resolved. Call with any concerns.        Follow up plan: Return in about 6 weeks (around 05/25/2023) for physical.    This visit was completed via video visit through MyChart due to the restrictions of the COVID-19 pandemic. All issues as above were discussed and addressed. Physical exam was done as above through visual confirmation on video through MyChart. If it was felt that the patient should be evaluated in the office, they were directed there. The patient verbally consented to this visit. Location of the patient: home Location of the provider: work Those involved with this call:  Provider: Olevia Perches, DO CMA: Malen Gauze, CMA Front Desk/Registration:  Servando Snare   Time spent on call:  15 minutes with patient face to face via video conference. More than 50% of this time was spent in counseling and coordination of care. 23 minutes total spent in review of patient's record and preparation of their chart.

## 2023-04-13 NOTE — Telephone Encounter (Signed)
Called and changed patient to MyChart Video Visit.

## 2023-04-30 ENCOUNTER — Ambulatory Visit (INDEPENDENT_AMBULATORY_CARE_PROVIDER_SITE_OTHER): Payer: 59 | Admitting: Family Medicine

## 2023-04-30 ENCOUNTER — Encounter: Payer: Self-pay | Admitting: Family Medicine

## 2023-04-30 VITALS — BP 164/67 | HR 47 | Ht 68.0 in | Wt 212.0 lb

## 2023-04-30 DIAGNOSIS — I10 Essential (primary) hypertension: Secondary | ICD-10-CM | POA: Diagnosis not present

## 2023-04-30 DIAGNOSIS — R600 Localized edema: Secondary | ICD-10-CM | POA: Diagnosis not present

## 2023-04-30 DIAGNOSIS — R21 Rash and other nonspecific skin eruption: Secondary | ICD-10-CM

## 2023-04-30 DIAGNOSIS — I499 Cardiac arrhythmia, unspecified: Secondary | ICD-10-CM

## 2023-04-30 MED ORDER — TRIAMCINOLONE ACETONIDE 0.5 % EX OINT: 1.0000 | TOPICAL_OINTMENT | Freq: Two times a day (BID) | CUTANEOUS | 0 refills | Status: DC

## 2023-04-30 MED ORDER — CHLORTHALIDONE 25 MG PO TABS: 25.0000 mg | ORAL_TABLET | Freq: Every day | ORAL | 3 refills | Status: DC

## 2023-04-30 NOTE — Progress Notes (Unsigned)
BP (!) 164/67 (BP Location: Left Arm, Cuff Size: Normal)   Pulse (!) 47   Ht 5\' 8"  (1.727 m)   Wt 212 lb (96.2 kg)   SpO2 100%   BMI 32.23 kg/m    Subjective:    Patient ID: Logan Hartman, male    DOB: 1946-01-10, 77 y.o.   MRN: [id number]  HPI: Logan Hartman is a 77 y.o. male  Chief Complaint  Patient presents with   Edema    Patient says he has noticed some redness and weeping in the back side of his R leg. Patient says the area was weeping this morning. Patient says it does not happen all the time, it will just start. Patient says the redness never completely went away after the course of treatment and says he will have some burning sensation.    HYPERTENSION Hypertension status: uncontrolled  Satisfied with current treatment? Not on anything Duration of hypertension: unknown BP monitoring frequency:  not checking BP range: 150s-160s systolic BP medication side effects:  N/A Medication compliance: N/A Aspirin: no Recurrent headaches: no Visual changes: no Palpitations: no Dyspnea: no Chest pain: no Lower extremity edema: yes Dizzy/lightheaded: no  RASH Duration:  weeks  Location: bilateral lower legs R>L  Itching: no Burning: yes Redness: yes Oozing: yes Scaling: yes Blisters: no Painful: no Fevers: no Change in detergents/soaps/personal care products: no Recent illness: yes Recent travel:no History of same: no Context: stable Alleviating factors: nothing Treatments attempted:antibiotic Shortness of breath: no  Throat/tongue swelling: no Myalgias/arthralgias: no   Relevant past medical, surgical, family and social history reviewed and updated as indicated. Interim medical history since our last visit reviewed. Allergies and medications reviewed and updated.  Review of Systems  Per HPI unless specifically indicated above     Objective:    BP (!) 164/67 (BP Location: Left Arm, Cuff Size: Normal)   Pulse (!) 47   Ht 5\' 8"  (1.727 m)   Wt 212 lb  (96.2 kg)   SpO2 100%   BMI 32.23 kg/m   Wt Readings from Last 3 Encounters:  04/30/23 212 lb (96.2 kg)  04/05/23 211 lb 9.6 oz (96 kg)  10/24/22 208 lb (94.3 kg)    Physical Exam Vitals and nursing note reviewed.  Constitutional:      General: He is not in acute distress.    Appearance: Normal appearance. He is not ill-appearing, toxic-appearing or diaphoretic.  HENT:     Head: Normocephalic and atraumatic.     Right Ear: External ear normal.     Left Ear: External ear normal.     Nose: Nose normal.     Mouth/Throat:     Mouth: Mucous membranes are moist.     Pharynx: Oropharynx is clear.  Eyes:     General: No scleral icterus.       Right eye: No discharge.        Left eye: No discharge.     Extraocular Movements: Extraocular movements intact.     Conjunctiva/sclera: Conjunctivae normal.     Pupils: Pupils are equal, round, and reactive to light.  Cardiovascular:     Rate and Rhythm: Normal rate. Rhythm irregular.     Pulses: Normal pulses.     Heart sounds: Normal heart sounds. No murmur heard.    No friction rub. No gallop.  Pulmonary:     Effort: Pulmonary effort is normal. No respiratory distress.     Breath sounds: Normal breath sounds. No stridor. No wheezing, rhonchi or  rales.  Chest:     Chest wall: No tenderness.  Musculoskeletal:        General: Normal range of motion.     Cervical back: Normal range of motion and neck supple.     Right lower leg: Edema present.     Left lower leg: Edema present.  Skin:    General: Skin is warm and dry.     Capillary Refill: Capillary refill takes less than 2 seconds.     Coloration: Skin is not jaundiced or pale.     Findings: Rash present. No bruising, erythema or lesion.     Comments: Irritated skin with peticheal rash on bilateral lower extremities.   Neurological:     General: No focal deficit present.     Mental Status: He is alert and oriented to person, place, and time. Mental status is at baseline.   Psychiatric:        Mood and Affect: Mood normal.        Behavior: Behavior normal.        Thought Content: Thought content normal.        Judgment: Judgment normal.     Results for orders placed or performed in visit on 11/12/20  Microscopic Examination   Urine  Result Value Ref Range   WBC, UA 0-5 0 - 5 /hpf   RBC, Urine None seen 0 - 2 /hpf   Epithelial Cells (non renal) 0-10 0 - 10 /hpf   Bacteria, UA None seen None seen/Few  Urinalysis, Complete  Result Value Ref Range   Specific Gravity, UA 1.010 1.005 - 1.030   pH, UA 6.5 5.0 - 7.5   Color, UA Yellow Yellow   Appearance Ur Hazy (A) Clear   Leukocytes,UA Negative Negative   Protein,UA Negative Negative/Trace   Glucose, UA Negative Negative   Ketones, UA Negative Negative   RBC, UA Negative Negative   Bilirubin, UA Negative Negative   Urobilinogen, Ur 1.0 0.2 - 1.0 mg/dL   Nitrite, UA Negative Negative   Microscopic Examination See below:       Assessment & Plan:   Problem List Items Addressed This Visit   None Visit Diagnoses     Primary hypertension    -  Primary   Rash       Peripheral edema            Follow up plan: No follow-ups on file.

## 2023-05-01 ENCOUNTER — Encounter: Payer: Self-pay | Admitting: Family Medicine

## 2023-05-01 DIAGNOSIS — I10 Essential (primary) hypertension: Secondary | ICD-10-CM | POA: Insufficient documentation

## 2023-05-01 NOTE — Assessment & Plan Note (Signed)
Will start him on chlorthalidone and recheck in 2 weeks. Call with any concerns.

## 2023-05-06 NOTE — Progress Notes (Signed)
Interpreted by me 04/30/23. Sinus Bradycardia at 44bpm. No ST segment changes

## 2023-05-15 ENCOUNTER — Encounter: Payer: Self-pay | Admitting: Family Medicine

## 2023-05-15 ENCOUNTER — Ambulatory Visit (INDEPENDENT_AMBULATORY_CARE_PROVIDER_SITE_OTHER): Payer: 59 | Admitting: Family Medicine

## 2023-05-15 VITALS — BP 135/75 | HR 66 | Temp 98.9°F | Resp 16 | Wt 207.8 lb

## 2023-05-15 DIAGNOSIS — R600 Localized edema: Secondary | ICD-10-CM | POA: Diagnosis not present

## 2023-05-15 DIAGNOSIS — I10 Essential (primary) hypertension: Secondary | ICD-10-CM | POA: Diagnosis not present

## 2023-05-15 MED ORDER — CHLORTHALIDONE 25 MG PO TABS: 25.0000 mg | ORAL_TABLET | Freq: Every day | ORAL | 1 refills | Status: DC

## 2023-05-15 MED ORDER — TRIAMCINOLONE ACETONIDE 0.5 % EX OINT: 1.0000 | TOPICAL_OINTMENT | Freq: Two times a day (BID) | CUTANEOUS | 3 refills | Status: DC

## 2023-05-15 NOTE — Progress Notes (Signed)
BP 135/75 (BP Location: Left Arm, Patient Position: Sitting, Cuff Size: Normal)   Pulse 66   Temp 98.9 F (37.2 C) (Oral)   Resp 16   Wt 207 lb 12.8 oz (94.3 kg)   SpO2 98%   BMI 31.60 kg/m    Subjective:    Patient ID: Logan Hartman, male    DOB: 1946-02-27, 77 y.o.   MRN: [id number]  HPI: Jemaine Zisk is a 76 y.o. male  Chief Complaint  Patient presents with   Irregular Heart Beat    No concerns   Rash    Mostly cleared   Edema    No swelling, pills work good   Rash resolved. Feeling better  HYPERTENSION  Hypertension status: better  Satisfied with current treatment? yes Duration of hypertension: chronic BP monitoring frequency:  rarely BP medication side effects:  no Medication compliance: excellent compliance Aspirin: no Recurrent headaches: no Visual changes: no Palpitations: no Dyspnea: no Chest pain: no Lower extremity edema: no Dizzy/lightheaded: no  Relevant past medical, surgical, family and social history reviewed and updated as indicated. Interim medical history since our last visit reviewed. Allergies and medications reviewed and updated.  Review of Systems  Constitutional: Negative.   Respiratory: Negative.    Cardiovascular: Negative.   Gastrointestinal: Negative.   Musculoskeletal: Negative.   Psychiatric/Behavioral: Negative.      Per HPI unless specifically indicated above     Objective:    BP 135/75 (BP Location: Left Arm, Patient Position: Sitting, Cuff Size: Normal)   Pulse 66   Temp 98.9 F (37.2 C) (Oral)   Resp 16   Wt 207 lb 12.8 oz (94.3 kg)   SpO2 98%   BMI 31.60 kg/m   Wt Readings from Last 3 Encounters:  05/15/23 207 lb 12.8 oz (94.3 kg)  04/30/23 212 lb (96.2 kg)  04/05/23 211 lb 9.6 oz (96 kg)    Physical Exam Vitals and nursing note reviewed.  Constitutional:      General: He is not in acute distress.    Appearance: Normal appearance. He is not ill-appearing, toxic-appearing or diaphoretic.  HENT:      Head: Normocephalic and atraumatic.     Right Ear: External ear normal.     Left Ear: External ear normal.     Nose: Nose normal.     Mouth/Throat:     Mouth: Mucous membranes are moist.     Pharynx: Oropharynx is clear.  Eyes:     General: No scleral icterus.       Right eye: No discharge.        Left eye: No discharge.     Extraocular Movements: Extraocular movements intact.     Conjunctiva/sclera: Conjunctivae normal.     Pupils: Pupils are equal, round, and reactive to light.  Cardiovascular:     Rate and Rhythm: Normal rate and regular rhythm.     Pulses: Normal pulses.     Heart sounds: Normal heart sounds. No murmur heard.    No friction rub. No gallop.  Pulmonary:     Effort: Pulmonary effort is normal. No respiratory distress.     Breath sounds: Normal breath sounds. No stridor. No wheezing, rhonchi or rales.  Chest:     Chest wall: No tenderness.  Musculoskeletal:        General: Normal range of motion.     Cervical back: Normal range of motion and neck supple.  Skin:    General: Skin is warm and dry.  Capillary Refill: Capillary refill takes less than 2 seconds.     Coloration: Skin is not jaundiced or pale.     Findings: No bruising, erythema, lesion or rash.  Neurological:     General: No focal deficit present.     Mental Status: He is alert and oriented to person, place, and time. Mental status is at baseline.  Psychiatric:        Mood and Affect: Mood normal.        Behavior: Behavior normal.        Thought Content: Thought content normal.        Judgment: Judgment normal.     Results for orders placed or performed in visit on 11/12/20  Microscopic Examination   Urine  Result Value Ref Range   WBC, UA 0-5 0 - 5 /hpf   RBC, Urine None seen 0 - 2 /hpf   Epithelial Cells (non renal) 0-10 0 - 10 /hpf   Bacteria, UA None seen None seen/Few  Urinalysis, Complete  Result Value Ref Range   Specific Gravity, UA 1.010 1.005 - 1.030   pH, UA 6.5 5.0 - 7.5    Color, UA Yellow Yellow   Appearance Ur Hazy (A) Clear   Leukocytes,UA Negative Negative   Protein,UA Negative Negative/Trace   Glucose, UA Negative Negative   Ketones, UA Negative Negative   RBC, UA Negative Negative   Bilirubin, UA Negative Negative   Urobilinogen, Ur 1.0 0.2 - 1.0 mg/dL   Nitrite, UA Negative Negative   Microscopic Examination See below:       Assessment & Plan:   Problem List Items Addressed This Visit       Cardiovascular and Mediastinum   Primary hypertension - Primary    Under good control on current regimen. Continue current regimen. Continue to monitor. Call with any concerns. Refills given. Labs drawn today.       Relevant Medications   sildenafil (REVATIO) 20 MG tablet   chlorthalidone (HYGROTON) 25 MG tablet   Other Relevant Orders   Basic metabolic panel   Other Visit Diagnoses     Peripheral edema       Significantly improved on chlorthalidone and compression hose. Continue. Labs drawn today. Call with any concerns. Continue to monitor.        Follow up plan: Return in about 5 months (around 10/25/2023) for physical.

## 2023-05-15 NOTE — Assessment & Plan Note (Signed)
Under good control on current regimen. Continue current regimen. Continue to monitor. Call with any concerns. Refills given. Labs drawn today.   

## 2023-05-16 LAB — BASIC METABOLIC PANEL
BUN/Creatinine Ratio: 23 (ref 10–24)
BUN: 17 mg/dL (ref 8–27)
CO2: 24 mmol/L (ref 20–29)
Calcium: 9.2 mg/dL (ref 8.6–10.2)
Chloride: 99 mmol/L (ref 96–106)
Creatinine, Ser: 0.73 mg/dL — ABNORMAL LOW (ref 0.76–1.27)
Glucose: 87 mg/dL (ref 70–99)
Potassium: 3.4 mmol/L — ABNORMAL LOW (ref 3.5–5.2)
Sodium: 139 mmol/L (ref 134–144)
eGFR: 94 mL/min/{1.73_m2} (ref >59)

## 2023-05-30 ENCOUNTER — Ambulatory Visit (INDEPENDENT_AMBULATORY_CARE_PROVIDER_SITE_OTHER): Payer: 59 | Admitting: Family Medicine

## 2023-05-30 ENCOUNTER — Encounter: Payer: Self-pay | Admitting: Family Medicine

## 2023-05-30 VITALS — BP 136/64 | HR 45 | Ht 68.0 in | Wt 208.8 lb

## 2023-05-30 DIAGNOSIS — Z0000 Encounter for general adult medical examination without abnormal findings: Secondary | ICD-10-CM | POA: Diagnosis not present

## 2023-05-30 DIAGNOSIS — R7301 Impaired fasting glucose: Secondary | ICD-10-CM

## 2023-05-30 DIAGNOSIS — E559 Vitamin D deficiency, unspecified: Secondary | ICD-10-CM

## 2023-05-30 DIAGNOSIS — Z136 Encounter for screening for cardiovascular disorders: Secondary | ICD-10-CM | POA: Diagnosis not present

## 2023-05-30 DIAGNOSIS — I10 Essential (primary) hypertension: Secondary | ICD-10-CM | POA: Diagnosis not present

## 2023-05-30 DIAGNOSIS — R3911 Hesitancy of micturition: Secondary | ICD-10-CM | POA: Diagnosis not present

## 2023-05-30 LAB — MICROALBUMIN, URINE WAIVED
Creatinine, Urine Waived: 200 mg/dL (ref 10–300)
Microalb, Ur Waived: 30 mg/L — ABNORMAL HIGH (ref 0–19)
Microalb/Creat Ratio: <30 mg/g (ref <30)

## 2023-05-30 LAB — BAYER DCA HB A1C WAIVED: HB A1C (BAYER DCA - WAIVED): 5.1 % (ref 4.8–5.6)

## 2023-05-30 NOTE — Progress Notes (Signed)
BP 136/64   Pulse (!) 45   Ht 5\' 8"  (1.727 m)   Wt 208 lb 12.8 oz (94.7 kg)   SpO2 98%   BMI 31.75 kg/m    Subjective:    Patient ID: Logan Hartman, male    DOB: 12/25/1945, 77 y.o.   MRN: [id number]  HPI: Logan Hartman is a 77 y.o. male presenting on 05/30/2023 for comprehensive medical examination. Current medical complaints include:  HYPERTENSION  Hypertension status: controlled  Satisfied with current treatment? yes Duration of hypertension: chronic BP monitoring frequency:  not checking BP medication side effects:  no Medication compliance: excellent compliance Previous BP meds:chlorthalidone Aspirin: no Recurrent headaches: no Visual changes: no Palpitations: no Dyspnea: no Chest pain: no Lower extremity edema: no Dizzy/lightheaded: no  Impaired Fasting Glucose HbA1C:  Lab Results  Component Value Date   HGBA1C 5.1 05/30/2023   Duration of elevated blood sugar: chronic Polydipsia: no Polyuria: no Weight change: no Visual disturbance: no Glucose Monitoring: no Diabetic Education: Completed Family history of diabetes: no  He currently lives with: wife Interim Problems from his last visit: no  Depression Screen done today and results listed below:     05/30/2023    9:41 AM 04/05/2023    3:42 PM 10/24/2022    3:44 PM 06/07/2022    1:13 PM  Depression screen PHQ 2/9  Decreased Interest 0 0 0 0  Down, Depressed, Hopeless 0 0 0 0  PHQ - 2 Score 0 0 0 0  Altered sleeping 0 0 0 1  Tired, decreased energy 0 0 0 0  Change in appetite 0 0 0 0  Feeling bad or failure about yourself  0 0 0 0  Trouble concentrating 0 0 0 0  Moving slowly or fidgety/restless 0 0 0 1  Suicidal thoughts 0 0 0 0  PHQ-9 Score 0 0 0 2  Difficult doing work/chores Not difficult at all Not difficult at all Not difficult at all Not difficult at all    Past Medical History:  Past Medical History:  Diagnosis Date   Allergy 1999   Dust, pollen, etc   Anxiety Most of my life    Family & War   Arthritis As I got older   Working in Sales promotion account executive gun   Cataract 1998   Had it corrected in [place]    Surgical History:  Past Surgical History:  Procedure Laterality Date   EYE SURGERY  1998   The cataract correction    Medications:  Current Outpatient Medications on File Prior to Visit  Medication Sig   chlorthalidone (HYGROTON) 25 MG tablet Take 1 tablet (25 mg total) by mouth daily.   sildenafil (REVATIO) 20 MG tablet Take 20 mg by mouth once.   triamcinolone ointment (KENALOG) 0.5 % Apply 1 Application topically 2 (two) times daily.   No current facility-administered medications on file prior to visit.    Allergies:  No Known Allergies  Social History:  Social History   Socioeconomic History   Marital status: Married    Spouse name: Not on file   Number of children: Not on file   Years of education: Not on file   Highest education level: 12th grade  Occupational History   Not on file  Tobacco Use   Smoking status: Former    Current packs/day: 0.00    Average packs/day: 2.0 packs/day for 15.0 years (30.0 ttl pk-yrs)    Types: Cigarettes    Start date: 10/04/1958  Quit date: 10/03/1973    Years since quitting: 49.6   Smokeless tobacco: Never  Vaping Use   Vaping status: Never Used  Substance and Sexual Activity   Alcohol use: Yes    Alcohol/week: 1.0 standard drink of alcohol    Types: 1 Standard drinks or equivalent per week   Drug use: Never   Sexual activity: Yes    Birth control/protection: Surgical  Other Topics Concern   Not on file  Social History Narrative   Not on file   Social Determinants of Health   Financial Resource Strain: Low Risk  (04/05/2023)   Overall Financial Resource Strain (CARDIA)    Difficulty of Paying Living Expenses: Not very hard  Food Insecurity: No Food Insecurity (04/05/2023)   Hunger Vital Sign    Worried About Running Out of Food in the Last Year: Never true    Ran Out of Food in the  Last Year: Never true  Transportation Needs: No Transportation Needs (04/05/2023)   PRAPARE - Administrator, Civil Service (Medical): No    Lack of Transportation (Non-Medical): No  Physical Activity: Insufficiently Active (04/05/2023)   Exercise Vital Sign    Days of Exercise per Week: 3 days    Minutes of Exercise per Session: 30 min  Stress: No Stress Concern Present (04/05/2023)   [employer] of Occupational Health - Occupational Stress Questionnaire    Feeling of Stress : Not at all  Social Connections: Socially Integrated (04/05/2023)   Social Connection and Isolation Panel [NHANES]    Frequency of Communication with Friends and Family: More than three times a week    Frequency of Social Gatherings with Friends and Family: Twice a week    Attends Religious Services: More than 4 times per year    Active Member of [employer] or Organizations: Yes    Attends Engineer, structural: More than 4 times per year    Marital Status: Married  Catering manager Violence: Not At Risk (10/24/2022)   Humiliation, Afraid, Rape, and Kick questionnaire    Fear of Current or Ex-Partner: No    Emotionally Abused: No    Physically Abused: No    Sexually Abused: No   Social History   Tobacco Use  Smoking Status Former   Current packs/day: 0.00   Average packs/day: 2.0 packs/day for 15.0 years (30.0 ttl pk-yrs)   Types: Cigarettes   Start date: 10/04/1958   Quit date: 10/03/1973   Years since quitting: 49.6  Smokeless Tobacco Never   Social History   Substance and Sexual Activity  Alcohol Use Yes   Alcohol/week: 1.0 standard drink of alcohol   Types: 1 Standard drinks or equivalent per week    Family History:  Family History  Problem Relation Age of Onset   Hearing loss Mother    Varicose Veins Mother    Alcohol abuse Father    Arthritis Father    Cancer Father    Anxiety disorder Brother     Past medical history, surgical history, medications, allergies,  family history and social history reviewed with patient today and changes made to appropriate areas of the chart.   Review of Systems  Constitutional: Negative.   HENT: Negative.    Eyes: Negative.   Respiratory: Negative.    Cardiovascular:  Positive for leg swelling. Negative for chest pain, palpitations, orthopnea, claudication and PND.  Gastrointestinal: Negative.   Genitourinary:  Positive for frequency. Negative for dysuria, flank pain, hematuria and urgency.  Musculoskeletal: Negative.  Skin: Negative.   Neurological: Negative.   Endo/Heme/Allergies:  Positive for environmental allergies. Negative for polydipsia. Does not bruise/bleed easily.  Psychiatric/Behavioral: Negative.     All other ROS negative except what is listed above and in the HPI.      Objective:    BP 136/64   Pulse (!) 45   Ht 5\' 8"  (1.727 m)   Wt 208 lb 12.8 oz (94.7 kg)   SpO2 98%   BMI 31.75 kg/m   Wt Readings from Last 3 Encounters:  05/30/23 208 lb 12.8 oz (94.7 kg)  05/15/23 207 lb 12.8 oz (94.3 kg)  04/30/23 212 lb (96.2 kg)    Physical Exam Vitals and nursing note reviewed.  Constitutional:      General: He is not in acute distress.    Appearance: Normal appearance. He is obese. He is not ill-appearing, toxic-appearing or diaphoretic.  HENT:     Head: Normocephalic and atraumatic.     Right Ear: Tympanic membrane, ear canal and external ear normal. There is no impacted cerumen.     Left Ear: Tympanic membrane, ear canal and external ear normal. There is no impacted cerumen.     Nose: Nose normal. No congestion or rhinorrhea.     Mouth/Throat:     Mouth: Mucous membranes are moist.     Pharynx: Oropharynx is clear. No oropharyngeal exudate or posterior oropharyngeal erythema.  Eyes:     General: No scleral icterus.       Right eye: No discharge.        Left eye: No discharge.     Extraocular Movements: Extraocular movements intact.     Conjunctiva/sclera: Conjunctivae normal.      Pupils: Pupils are equal, round, and reactive to light.  Neck:     Vascular: No carotid bruit.  Cardiovascular:     Rate and Rhythm: Normal rate and regular rhythm.     Pulses: Normal pulses.     Heart sounds: No murmur heard.    No friction rub. No gallop.  Pulmonary:     Effort: Pulmonary effort is normal. No respiratory distress.     Breath sounds: Normal breath sounds. No stridor. No wheezing, rhonchi or rales.  Chest:     Chest wall: No tenderness.  Abdominal:     General: Abdomen is flat. Bowel sounds are normal. There is no distension.     Palpations: Abdomen is soft. There is no mass.     Tenderness: There is no abdominal tenderness. There is no right CVA tenderness, left CVA tenderness, guarding or rebound.     Hernia: No hernia is present.  Genitourinary:    Comments: Genital exam deferred with shared decision making Musculoskeletal:        General: No swelling, tenderness, deformity or signs of injury.     Cervical back: Normal range of motion and neck supple. No rigidity. No muscular tenderness.     Right lower leg: No edema.     Left lower leg: No edema.  Lymphadenopathy:     Cervical: No cervical adenopathy.  Skin:    General: Skin is warm and dry.     Capillary Refill: Capillary refill takes less than 2 seconds.     Coloration: Skin is not jaundiced or pale.     Findings: No bruising, erythema, lesion or rash.  Neurological:     General: No focal deficit present.     Mental Status: He is alert and oriented to person, place, and time.  Cranial Nerves: No cranial nerve deficit.     Sensory: No sensory deficit.     Motor: No weakness.     Coordination: Coordination normal.     Gait: Gait normal.     Deep Tendon Reflexes: Reflexes normal.  Psychiatric:        Mood and Affect: Mood normal.        Behavior: Behavior normal.        Thought Content: Thought content normal.        Judgment: Judgment normal.     Results for orders placed or performed in visit  on 05/30/23  Comprehensive metabolic panel  Result Value Ref Range   Glucose 90 70 - 99 mg/dL   BUN 13 8 - 27 mg/dL   Creatinine, Ser 1.61 (L) 0.76 - 1.27 mg/dL   eGFR 95 >09 UE/AVW/0.98   BUN/Creatinine Ratio 18 10 - 24   Sodium 142 134 - 144 mmol/L   Potassium 3.9 3.5 - 5.2 mmol/L   Chloride 101 96 - 106 mmol/L   CO2 24 20 - 29 mmol/L   Calcium 9.4 8.6 - 10.2 mg/dL   Total Protein 6.5 6.0 - 8.5 g/dL   Albumin 4.4 3.8 - 4.8 g/dL   Globulin, Total 2.1 1.5 - 4.5 g/dL   Bilirubin Total 0.9 0.0 - 1.2 mg/dL   Alkaline Phosphatase 63 44 - 121 IU/L   AST 23 0 - 40 IU/L   ALT 25 0 - 44 IU/L  CBC with Differential/Platelet  Result Value Ref Range   WBC 4.9 3.4 - 10.8 x10E3/uL   RBC 4.44 4.14 - 5.80 x10E6/uL   Hemoglobin 14.2 13.0 - 17.7 g/dL   Hematocrit 11.9 14.7 - 51.0 %   MCV 95 79 - 97 fL   MCH 32.0 26.6 - 33.0 pg   MCHC 33.7 31.5 - 35.7 g/dL   RDW 82.9 56.2 - 13.0 %   Platelets 230 150 - 450 x10E3/uL   Neutrophils 54 Not Estab. %   Lymphs 36 Not Estab. %   Monocytes 7 Not Estab. %   Eos 2 Not Estab. %   Basos 1 Not Estab. %   Neutrophils Absolute 2.7 1.4 - 7.0 x10E3/uL   Lymphocytes Absolute 1.8 0.7 - 3.1 x10E3/uL   Monocytes Absolute 0.3 0.1 - 0.9 x10E3/uL   EOS (ABSOLUTE) 0.1 0.0 - 0.4 x10E3/uL   Basophils Absolute 0.0 0.0 - 0.2 x10E3/uL   Immature Granulocytes 0 Not Estab. %   Immature Grans (Abs) 0.0 0.0 - 0.1 x10E3/uL  Lipid Panel w/o Chol/HDL Ratio  Result Value Ref Range   Cholesterol, Total 236 (H) 100 - 199 mg/dL   Triglycerides 865 (H) 0 - 149 mg/dL   HDL 61 >78 mg/dL   VLDL Cholesterol Cal 29 5 - 40 mg/dL   LDL Chol Calc (NIH) 469 (H) 0 - 99 mg/dL  PSA  Result Value Ref Range   Prostate Specific Ag, Serum 1.0 0.0 - 4.0 ng/mL  TSH  Result Value Ref Range   TSH 2.280 0.450 - 4.500 uIU/mL  Microalbumin, Urine Waived  Result Value Ref Range   Microalb, Ur Waived 30 (H) 0 - 19 mg/L   Creatinine, Urine Waived 200 10 - 300 mg/dL   Microalb/Creat Ratio <30  <30 mg/g  VITAMIN D 25 Hydroxy (Vit-D Deficiency, Fractures)  Result Value Ref Range   Vit D, 25-Hydroxy 23.9 (L) 30.0 - 100.0 ng/mL  Bayer DCA Hb A1c Waived  Result Value Ref Range   HB  A1C (BAYER DCA - WAIVED) 5.1 4.8 - 5.6 %      Assessment & Plan:   Problem List Items Addressed This Visit       Cardiovascular and Mediastinum   Primary hypertension    Under good control on current regimen. Continue current regimen. Continue to monitor. Call with any concerns. Refills given. Labs drawn today.        Relevant Orders   Comprehensive metabolic panel (Completed)   CBC with Differential/Platelet (Completed)   TSH (Completed)   Microalbumin, Urine Waived (Completed)     Endocrine   Impaired fasting glucose    Labs drawn today. Await results. Treat as needed.       Relevant Orders   Comprehensive metabolic panel (Completed)   CBC with Differential/Platelet (Completed)   Bayer DCA Hb A1c Waived (Completed)     Other   Vitamin D deficiency    Labs drawn today. Await results. Treat as needed.       Relevant Orders   VITAMIN D 25 Hydroxy (Vit-D Deficiency, Fractures) (Completed)   Other Visit Diagnoses     Routine general medical examination at a health care facility    -  Primary   Vaccines up to date. Screening labs checked today. Continue diet and exercise. Call with any concerns.   Hesitancy       Labs drawn today. Await results. Treat as needed.   Relevant Orders   PSA (Completed)   Screening for cardiovascular condition       Labs drawn today. Await results. Treat as needed.   Relevant Orders   Lipid Panel w/o Chol/HDL Ratio (Completed)        LABORATORY TESTING:  Health maintenance labs ordered today as discussed above.   The natural history of prostate cancer and ongoing controversy regarding screening and potential treatment outcomes of prostate cancer has been discussed with the patient. The meaning of a false positive PSA and a false negative PSA has  been discussed. He indicates understanding of the limitations of this screening test and wishes to proceed with screening PSA testing.   IMMUNIZATIONS:   - Tdap: Tetanus vaccination status reviewed: last tetanus booster within 10 years. - Influenza: Up to date - Pneumovax: Up to date - Prevnar: Up to date - COVID: Up to date - HPV: Not applicable - Shingrix vaccine: Refused  SCREENING: - Colonoscopy: Not applicable  Discussed with patient purpose of the colonoscopy is to detect colon cancer at curable precancerous or early stages   PATIENT COUNSELING:    Sexuality: Discussed sexually transmitted diseases, partner selection, use of condoms, avoidance of unintended pregnancy  and contraceptive alternatives.   Advised to avoid cigarette smoking.  I discussed with the patient that most people either abstain from alcohol or drink within safe limits (<=14/week and <=4 drinks/occasion for males, <=7/weeks and <= 3 drinks/occasion for females) and that the risk for alcohol disorders and other health effects rises proportionally with the number of drinks per week and how often a drinker exceeds daily limits.  Discussed cessation/primary prevention of drug use and availability of treatment for abuse.   Diet: Encouraged to adjust caloric intake to maintain  or achieve ideal body weight, to reduce intake of dietary saturated fat and total fat, to limit sodium intake by avoiding high sodium foods and not adding table salt, and to maintain adequate dietary potassium and calcium preferably from fresh fruits, vegetables, and low-fat dairy products.    stressed the importance of regular exercise  Injury prevention: Discussed safety belts, safety helmets, smoke detector, smoking near bedding or upholstery.   Dental health: Discussed importance of regular tooth brushing, flossing, and dental visits.   Follow up plan: NEXT PREVENTATIVE PHYSICAL DUE IN 1 YEAR. Return in about 5 months (around  10/28/2023).

## 2023-05-31 LAB — COMPREHENSIVE METABOLIC PANEL
ALT: 25 [IU]/L (ref 0–44)
AST: 23 [IU]/L (ref 0–40)
Albumin: 4.4 g/dL (ref 3.8–4.8)
Alkaline Phosphatase: 63 [IU]/L (ref 44–121)
BUN/Creatinine Ratio: 18 (ref 10–24)
BUN: 13 mg/dL (ref 8–27)
Bilirubin Total: 0.9 mg/dL (ref 0.0–1.2)
CO2: 24 mmol/L (ref 20–29)
Calcium: 9.4 mg/dL (ref 8.6–10.2)
Chloride: 101 mmol/L (ref 96–106)
Creatinine, Ser: 0.71 mg/dL — ABNORMAL LOW (ref 0.76–1.27)
Globulin, Total: 2.1 g/dL (ref 1.5–4.5)
Glucose: 90 mg/dL (ref 70–99)
Potassium: 3.9 mmol/L (ref 3.5–5.2)
Sodium: 142 mmol/L (ref 134–144)
Total Protein: 6.5 g/dL (ref 6.0–8.5)
eGFR: 95 mL/min/{1.73_m2} (ref >59)

## 2023-05-31 LAB — CBC WITH DIFFERENTIAL/PLATELET
Basophils Absolute: 0 10*3/uL (ref 0.0–0.2)
Basos: 1 %
EOS (ABSOLUTE): 0.1 10*3/uL (ref 0.0–0.4)
Eos: 2 %
Hematocrit: 42.1 % (ref 37.5–51.0)
Hemoglobin: 14.2 g/dL (ref 13.0–17.7)
Immature Grans (Abs): 0 10*3/uL (ref 0.0–0.1)
Immature Granulocytes: 0 %
Lymphocytes Absolute: 1.8 10*3/uL (ref 0.7–3.1)
Lymphs: 36 %
MCH: 32 pg (ref 26.6–33.0)
MCHC: 33.7 g/dL (ref 31.5–35.7)
MCV: 95 fL (ref 79–97)
Monocytes Absolute: 0.3 10*3/uL (ref 0.1–0.9)
Monocytes: 7 %
Neutrophils Absolute: 2.7 10*3/uL (ref 1.4–7.0)
Neutrophils: 54 %
Platelets: 230 10*3/uL (ref 150–450)
RBC: 4.44 x10E6/uL (ref 4.14–5.80)
RDW: 12.2 % (ref 11.6–15.4)
WBC: 4.9 10*3/uL (ref 3.4–10.8)

## 2023-05-31 LAB — VITAMIN D 25 HYDROXY (VIT D DEFICIENCY, FRACTURES): Vit D, 25-Hydroxy: 23.9 ng/mL — ABNORMAL LOW (ref 30.0–100.0)

## 2023-05-31 LAB — PSA: Prostate Specific Ag, Serum: 1 ng/mL (ref 0.0–4.0)

## 2023-05-31 LAB — LIPID PANEL W/O CHOL/HDL RATIO
Cholesterol, Total: 236 mg/dL — ABNORMAL HIGH (ref 100–199)
HDL: 61 mg/dL (ref >39)
LDL Chol Calc (NIH): 146 mg/dL — ABNORMAL HIGH (ref 0–99)
Triglycerides: 161 mg/dL — ABNORMAL HIGH (ref 0–149)
VLDL Cholesterol Cal: 29 mg/dL (ref 5–40)

## 2023-05-31 LAB — TSH: TSH: 2.28 u[IU]/mL (ref 0.450–4.500)

## 2023-06-01 NOTE — Assessment & Plan Note (Signed)
Labs drawn today. Await results. Treat as needed.  

## 2023-06-01 NOTE — Assessment & Plan Note (Signed)
Under good control on current regimen. Continue current regimen. Continue to monitor. Call with any concerns. Refills given. Labs drawn today.   

## 2023-06-06 ENCOUNTER — Other Ambulatory Visit: Payer: Self-pay | Admitting: Urology

## 2023-07-24 ENCOUNTER — Ambulatory Visit: Payer: 59 | Attending: Cardiovascular Disease | Admitting: Cardiovascular Disease

## 2023-07-24 ENCOUNTER — Encounter: Payer: Self-pay | Admitting: Cardiovascular Disease

## 2023-07-24 VITALS — BP 136/70 | HR 56 | Ht 68.0 in | Wt 208.8 lb

## 2023-07-24 DIAGNOSIS — E785 Hyperlipidemia, unspecified: Secondary | ICD-10-CM

## 2023-07-24 DIAGNOSIS — N400 Enlarged prostate without lower urinary tract symptoms: Secondary | ICD-10-CM | POA: Insufficient documentation

## 2023-07-24 DIAGNOSIS — I10 Essential (primary) hypertension: Secondary | ICD-10-CM | POA: Diagnosis not present

## 2023-07-24 DIAGNOSIS — I872 Venous insufficiency (chronic) (peripheral): Secondary | ICD-10-CM

## 2023-07-24 DIAGNOSIS — R600 Localized edema: Secondary | ICD-10-CM

## 2023-07-24 DIAGNOSIS — Z8679 Personal history of other diseases of the circulatory system: Secondary | ICD-10-CM | POA: Insufficient documentation

## 2023-07-24 NOTE — Progress Notes (Unsigned)
Cardiology Office Note   Date:  07/24/2023   ID:  Logan Hartman, Logan Hartman 1945/10/03, MRN [id number]  PCP:  Dorcas Carrow, DO  Cardiologist:   Lorine Bears, MD   Chief Complaint  Patient presents with   New Patient (Initial Visit)    Referred for cardiac evaluation of Peripheral edema.  Patient reports edema for a while and getting relief with addition of Chlorthalidone.      History of Present Illness: Logan Hartman is a 78 y.o. male who was referred by Dr. Laural Benes for evaluation and management of lower extremity edema. He has no prior cardiac history.  He has known history of essential hypertension and hyperlipidemia.  His blood pressure was significantly elevated in November with lower extremity edema.  He was started on chlorthalidone with subsequent improvement. He is very active overall and works around the house on projects all the time.  He denies any chest pain, shortness of breath or palpitations.  He has chronic bradycardia but denies dizziness or syncope.  He is a lifelong non-smoker.  His father died at the age of 32 of myocardial infarction. He uses knee-high support stockings during the day.    Past Medical History:  Diagnosis Date   Allergy 1999   Dust, pollen, etc   Anxiety Most of my life   Family & War   Arthritis As I got older   Working in Sales promotion account executive gun   Cataract 1998   Had it corrected in [place]    Past Surgical History:  Procedure Laterality Date   EYE SURGERY  1998   The cataract correction   VASECTOMY       Current Outpatient Medications  Medication Sig Dispense Refill   chlorthalidone (HYGROTON) 25 MG tablet Take 1 tablet (25 mg total) by mouth daily. 90 tablet 1   Multiple Vitamin (MULTIVITAMIN WITH MINERALS) TABS tablet Take 1 tablet by mouth daily.     Multiple Vitamins-Minerals (PRESERVISION AREDS PO) Take by mouth.     sildenafil (REVATIO) 20 MG tablet TAKE 1 TO 5 TABLETS BY MOUTH AS NEEDED 30 tablet 1   triamcinolone  ointment (KENALOG) 0.5 % Apply 1 Application topically 2 (two) times daily. 90 g 3   No current facility-administered medications for this visit.    Allergies:   Patient has no known allergies.    Social History:  The patient  reports that he quit smoking about 49 years ago. His smoking use included cigarettes. He started smoking about 64 years ago. He has a 30 pack-year smoking history. He has never used smokeless tobacco. He reports current alcohol use of about 1.0 standard drink of alcohol per week. He reports that he does not use drugs.   Family History:  The patient's family history includes Alcohol abuse in his father; Anxiety disorder in his brother; Arthritis in his father; Cancer in his father; Hearing loss in his mother; Heart attack in his paternal grandfather; Varicose Veins in his mother.    ROS:  Please see the history of present illness.   Otherwise, review of systems are positive for none.   All other systems are reviewed and negative.    PHYSICAL EXAM: VS:  BP 136/70 (BP Location: Right Arm, Patient Position: Sitting, Cuff Size: Large)   Pulse (!) 56   Ht 5\' 8"  (1.727 m)   Wt 208 lb 12.8 oz (94.7 kg)   SpO2 99%   BMI 31.75 kg/m  , BMI Body mass index is 31.75 kg/m.  GEN: Well nourished, well developed, in no acute distress  HEENT: normal  Neck: no JVD, carotid bruits, or masses Cardiac: RRR; no murmurs, rubs, or gallops, trace edema  Respiratory:  clear to auscultation bilaterally, normal work of breathing GI: soft, nontender, nondistended, + BS MS: no deformity or atrophy  Skin: warm and dry, no rash Neuro:  Strength and sensation are intact Psych: euthymic mood, full affect   EKG:  EKG is ordered today. The ekg ordered today demonstrates : Sinus bradycardia with sinus arrhythmia       Recent Labs: 05/30/2023: ALT 25; BUN 13; Creatinine, Ser 0.71; Hemoglobin 14.2; Platelets 230; Potassium 3.9; Sodium 142; TSH 2.280    Lipid Panel    Component Value  Date/Time   CHOL 236 (H) 05/30/2023 1036   TRIG 161 (H) 05/30/2023 1036   HDL 61 05/30/2023 1036   LDLCALC 146 (H) 05/30/2023 1036      Wt Readings from Last 3 Encounters:  07/24/23 208 lb 12.8 oz (94.7 kg)  05/30/23 208 lb 12.8 oz (94.7 kg)  05/15/23 207 lb 12.8 oz (94.3 kg)         07/24/2023    1:57 PM  PAD Screen  Previous PAD dx? No  Previous surgical procedure? No  Pain with walking? No  Feet/toe relief with dangling? No  Painful, non-healing ulcers? No  Extremities discolored? No      ASSESSMENT AND PLAN:  1.  Chronic venous insufficiency: I suspect that his lower extremity edema is due to chronic venous insufficiency as he has no signs or symptoms of congestive heart failure.  Edema improved significantly with the addition of chlorthalidone which also helped his high blood pressure.  I discussed with him the importance of leg elevation and knee-high support stockings to be used during the day.  We also discussed the importance of daily walking.  2.  Essential hypertension: Blood pressure is controlled on chlorthalidone.  3.  Mixed hyperlipidemia: Recent lipid profile showed triglyceride of 161 and an LDL of 146.  His father had myocardial infarction at the age of 50 but he was a smoker.  I discussed with him the option of risk stratification with CT calcium score and he is agreeable.  This will help with his hyperlipidemia management.    Disposition:   FU as needed if CT calcium score is abnormal.  Signed,  Lorine Bears, MD  07/24/2023 2:14 PM    [facility]

## 2023-07-24 NOTE — Patient Instructions (Signed)
Medication Instructions:  Your physician recommends that you continue on your current medications as directed. Please refer to the Current Medication list given to you today.  *If you need a refill on your cardiac medications before your next appointment, please call your pharmacy*   Testing/Procedures: Dr. Kirke Corin has ordered a CT coronary calcium score.   Test locations:  [facility]  [facility]  This is $99 out of pocket.   Coronary CalciumScan A coronary calcium scan is an imaging test used to look for deposits of calcium and other fatty materials (plaques) in the inner lining of the blood vessels of the heart (coronary arteries). These deposits of calcium and plaques can partly clog and narrow the coronary arteries without producing any symptoms or warning signs. This puts a person at risk for a heart attack. This test can detect these deposits before symptoms develop. Tell a health care provider about: Any allergies you have. All medicines you are taking, including vitamins, herbs, eye drops, creams, and over-the-counter medicines. Any problems you or family members have had with anesthetic medicines. Any blood disorders you have. Any surgeries you have had. Any medical conditions you have. Whether you are pregnant or may be pregnant. What are the risks? Generally, this is a safe procedure. However, problems may occur, including: Harm to a pregnant woman and her unborn baby. This test involves the use of radiation. Radiation exposure can be dangerous to a pregnant woman and her unborn baby. If you are pregnant, you generally should not have this procedure done. Slight increase in the risk of cancer. This is because of the radiation involved in the test. What happens before the procedure? No preparation is needed for this procedure. What happens during the procedure? You will undress and  remove any jewelry around your neck or chest. You will put on a hospital gown. Sticky electrodes will be placed on your chest. The electrodes will be connected to an electrocardiogram (ECG) machine to record a tracing of the electrical activity of your heart. A CT scanner will take pictures of your heart. During this time, you will be asked to lie still and hold your breath for 2-3 seconds while a picture of your heart is being taken. The procedure may vary among health care providers and hospitals. What happens after the procedure? You can get dressed. You can return to your normal activities. It is up to you to get the results of your test. Ask your health care provider, or the department that is doing the test, when your results will be ready. Summary A coronary calcium scan is an imaging test used to look for deposits of calcium and other fatty materials (plaques) in the inner lining of the blood vessels of the heart (coronary arteries). Generally, this is a safe procedure. Tell your health care provider if you are pregnant or may be pregnant. No preparation is needed for this procedure. A CT scanner will take pictures of your heart. You can return to your normal activities after the scan is done. This information is not intended to replace advice given to you by your health care provider. Make sure you discuss any questions you have with your health care provider. Document Released: 12/09/2007 Document Revised: 05/01/2016 Document Reviewed: 05/01/2016 Elsevier Interactive Patient Education  2017 [employer].    Follow-Up: At [facility], you and your health needs are our priority.  As part of our continuing  mission to provide you with exceptional heart care, we have created designated Provider Care Teams.  These Care Teams include your primary Cardiologist (physician) and Advanced Practice Providers (APPs -  Physician Assistants and Nurse Practitioners) who all work together to  provide you with the care you need, when you need it.   Your next appointment:    As needed   Provider:   Lorine Bears, MD

## 2023-07-26 ENCOUNTER — Other Ambulatory Visit: Payer: Self-pay | Admitting: Family Medicine

## 2023-07-27 NOTE — Telephone Encounter (Signed)
Requested Prescriptions  Refused Prescriptions Disp Refills   chlorthalidone (HYGROTON) 25 MG tablet [Pharmacy Med Name: CHLORTHALIDONE 25MG  TABLETS] 90 tablet 1    Sig: TAKE 1 TABLET(25 MG) BY MOUTH DAILY     Cardiovascular: Diuretics - Thiazide Failed - 07/27/2023  9:46 AM      Failed - Cr in normal range and within 180 days    Creatinine, Ser  Date Value Ref Range Status  05/30/2023 0.71 (L) 0.76 - 1.27 mg/dL Final         Passed - K in normal range and within 180 days    Potassium  Date Value Ref Range Status  05/30/2023 3.9 3.5 - 5.2 mmol/L Final         Passed - Na in normal range and within 180 days    Sodium  Date Value Ref Range Status  05/30/2023 142 134 - 144 mmol/L Final         Passed - Last BP in normal range    BP Readings from Last 1 Encounters:  07/24/23 136/70         Passed - Valid encounter within last 6 months    Recent Outpatient Visits           1 month ago Routine general medical examination at a health care facility   [facility] [place], [place] P, DO   2 months ago Primary hypertension   [place] [facility] [place], [place], DO   2 months ago Primary hypertension   [place] [place], [place], DO   3 months ago COVID   [place] [facility] [place], [place], DO   3 months ago Cellulitis of right leg   [place] [facility] [place], Oralia Rud, DO       Future Appointments             In 2 months Stoioff, Verna Czech, MD [facility] [place]   In 3 months Laural Benes, Oralia Rud, DO  [facility]

## 2023-07-31 ENCOUNTER — Ambulatory Visit
Admission: RE | Admit: 2023-07-31 | Discharge: 2023-07-31 | Disposition: A | Discharge status: Home / Self Care | Payer: Self-pay | Source: Ambulatory Visit | Attending: Cardiovascular Disease | Admitting: Cardiovascular Disease

## 2023-07-31 DIAGNOSIS — E785 Hyperlipidemia, unspecified: Secondary | ICD-10-CM | POA: Insufficient documentation

## 2023-08-03 ENCOUNTER — Telehealth: Payer: Self-pay | Admitting: *Deleted

## 2023-08-03 MED ORDER — ASPIRIN 81 MG PO TBEC: 81.0000 mg | DELAYED_RELEASE_TABLET | Freq: Every day | ORAL | Status: DC

## 2023-08-03 NOTE — Telephone Encounter (Signed)
-----   Message from [place] sent at 08/01/2023  4:34 PM EST ----- His calcium score is moderately elevated. Recommend starting aspirin  81 mg once daily and atorvastatin 40 mg once daily. Check lipid and liver profile in 2 months. Follow-up with APP in 3 to 4 months.  Cc: Dr. Vicci

## 2023-08-03 NOTE — Telephone Encounter (Signed)
 The patient's wife has been made aware of the results, per the dpr. She stated that the patient will start a baby aspirin  81 mg once daily but has declined starting the atorvastatin. She stated that they have discussed this before with the PCP and they feel like with his age they do not want to risk the possible side effects. Education has been provided as to the benefits of the statin but she has declined.  They will be going to [place]  soon through July so asked for a follow up in August. A recall has been placed.

## 2023-09-05 ENCOUNTER — Telehealth: Payer: Self-pay

## 2023-09-05 DIAGNOSIS — Z9189 Other specified personal risk factors, not elsewhere classified: Secondary | ICD-10-CM

## 2023-09-05 NOTE — Telephone Encounter (Signed)
 Patient's wife called and wants to see if we can order the MMR titer to check for immunity. Can this be ordered and the patient do a lab only visit?

## 2023-09-06 ENCOUNTER — Other Ambulatory Visit

## 2023-09-06 DIAGNOSIS — Z9189 Other specified personal risk factors, not elsewhere classified: Secondary | ICD-10-CM

## 2023-09-06 NOTE — Telephone Encounter (Signed)
 Called and scheduled lab visit today for MMR titer.

## 2023-09-06 NOTE — Telephone Encounter (Signed)
 Absolutely. Order's in- he can come in whenever he'd like

## 2023-09-07 ENCOUNTER — Encounter: Payer: Self-pay | Admitting: Family Medicine

## 2023-09-07 LAB — MEASLES/MUMPS/RUBELLA IMMUNITY
MUMPS ABS, IGG: >300 [AU]/ml (ref >10.9)
RUBEOLA AB, IGG: >300 [AU]/ml (ref >16.4)
Rubella Antibodies, IGG: 23.1 {index} (ref >0.99)

## 2023-10-19 ENCOUNTER — Ambulatory Visit

## 2023-10-19 DIAGNOSIS — Z23 Encounter for immunization: Secondary | ICD-10-CM | POA: Diagnosis not present

## 2023-10-19 DIAGNOSIS — Z719 Counseling, unspecified: Secondary | ICD-10-CM

## 2023-10-19 NOTE — Progress Notes (Signed)
 Patient seen in nurse clinic for booster dose of COVID vaccine. Comirnaty +12Y [id number] given.  Tolerated well. VIS provided. [facility] updated and copy provided. Waited 10 minutes.

## 2023-10-24 ENCOUNTER — Ambulatory Visit: Payer: Self-pay | Admitting: Urology

## 2023-10-25 ENCOUNTER — Encounter: Payer: Self-pay | Admitting: Family Medicine

## 2023-10-25 ENCOUNTER — Ambulatory Visit (INDEPENDENT_AMBULATORY_CARE_PROVIDER_SITE_OTHER): Payer: Self-pay | Admitting: Family Medicine

## 2023-10-25 VITALS — BP 124/67 | HR 55 | Ht 67.0 in | Wt 211.2 lb

## 2023-10-25 DIAGNOSIS — I10 Essential (primary) hypertension: Secondary | ICD-10-CM

## 2023-10-25 DIAGNOSIS — Z1159 Encounter for screening for other viral diseases: Secondary | ICD-10-CM | POA: Diagnosis not present

## 2023-10-25 DIAGNOSIS — E559 Vitamin D deficiency, unspecified: Secondary | ICD-10-CM

## 2023-10-25 DIAGNOSIS — N401 Enlarged prostate with lower urinary tract symptoms: Secondary | ICD-10-CM

## 2023-10-25 DIAGNOSIS — R7301 Impaired fasting glucose: Secondary | ICD-10-CM | POA: Diagnosis not present

## 2023-10-25 LAB — BAYER DCA HB A1C WAIVED: HB A1C (BAYER DCA - WAIVED): 4.8 % (ref 4.8–5.6)

## 2023-10-25 MED ORDER — TRIAMCINOLONE ACETONIDE 0.5 % EX OINT: 1.0000 | TOPICAL_OINTMENT | Freq: Two times a day (BID) | CUTANEOUS | 3 refills | Status: DC

## 2023-10-25 MED ORDER — CHLORTHALIDONE 25 MG PO TABS: 25.0000 mg | ORAL_TABLET | Freq: Every day | ORAL | 1 refills | Status: DC

## 2023-10-25 MED ORDER — CHLORTHALIDONE 25 MG PO TABS: 25.0000 mg | ORAL_TABLET | Freq: Every day | ORAL | 2 refills | Status: DC

## 2023-10-25 NOTE — Assessment & Plan Note (Signed)
 Rechecking labs today. Await results. Treat as needed.

## 2023-10-25 NOTE — Progress Notes (Signed)
 BP 124/67 (BP Location: Left Arm, Patient Position: Sitting, Cuff Size: Normal)   Pulse (!) 55   Ht 5\' 7"  (1.702 m)   Wt 211 lb 3.2 oz (95.8 kg)   SpO2 94%   BMI 33.08 kg/m    Subjective:    Patient ID: Logan Hartman, male    DOB: Mar 08, 1946, 78 y.o.   MRN: [id number]  HPI: Logan Hartman is a 78 y.o. male  Chief Complaint  Patient presents with   Hypertension   HYPERTENSION  Hypertension status: controlled  Satisfied with current treatment? yes Duration of hypertension: chronic BP monitoring frequency:  not checking BP medication side effects:  no Medication compliance: excellent compliance Previous BP meds:chlorthalidone  Aspirin : no Recurrent headaches: no Visual changes: no Palpitations: no Dyspnea: no Chest pain: no Lower extremity edema: no Dizzy/lightheaded: no  Impaired Fasting Glucose HbA1C:  Lab Results  Component Value Date   HGBA1C 5.1 05/30/2023   Duration of elevated blood sugar: chronic Polydipsia: no Polyuria: no Weight change: no Visual disturbance: no Glucose Monitoring: no Diabetic Education: Not Completed Family history of diabetes: yes    Relevant past medical, surgical, family and social history reviewed and updated as indicated. Interim medical history since our last visit reviewed. Allergies and medications reviewed and updated.  Review of Systems  Constitutional: Negative.   Respiratory: Negative.    Cardiovascular: Negative.   Musculoskeletal: Negative.   Psychiatric/Behavioral: Negative.      Per HPI unless specifically indicated above     Objective:    BP 124/67 (BP Location: Left Arm, Patient Position: Sitting, Cuff Size: Normal)   Pulse (!) 55   Ht 5\' 7"  (1.702 m)   Wt 211 lb 3.2 oz (95.8 kg)   SpO2 94%   BMI 33.08 kg/m   Wt Readings from Last 3 Encounters:  10/25/23 211 lb 3.2 oz (95.8 kg)  07/24/23 208 lb 12.8 oz (94.7 kg)  05/30/23 208 lb 12.8 oz (94.7 kg)    Physical Exam Vitals and nursing note  reviewed.  Constitutional:      General: He is not in acute distress.    Appearance: Normal appearance. He is not ill-appearing, toxic-appearing or diaphoretic.  HENT:     Head: Normocephalic and atraumatic.     Right Ear: External ear normal.     Left Ear: External ear normal.     Nose: Nose normal.     Mouth/Throat:     Mouth: Mucous membranes are moist.     Pharynx: Oropharynx is clear.  Eyes:     General: No scleral icterus.       Right eye: No discharge.        Left eye: No discharge.     Extraocular Movements: Extraocular movements intact.     Conjunctiva/sclera: Conjunctivae normal.     Pupils: Pupils are equal, round, and reactive to light.  Cardiovascular:     Rate and Rhythm: Normal rate and regular rhythm.     Pulses: Normal pulses.     Heart sounds: Normal heart sounds. No murmur heard.    No friction rub. No gallop.  Pulmonary:     Effort: Pulmonary effort is normal. No respiratory distress.     Breath sounds: Normal breath sounds. No stridor. No wheezing, rhonchi or rales.  Chest:     Chest wall: No tenderness.  Musculoskeletal:        General: Normal range of motion.     Cervical back: Normal range of motion and neck supple.  Skin:    General: Skin is warm and dry.     Capillary Refill: Capillary refill takes less than 2 seconds.     Coloration: Skin is not jaundiced or pale.     Findings: No bruising, erythema, lesion or rash.  Neurological:     General: No focal deficit present.     Mental Status: He is alert and oriented to person, place, and time. Mental status is at baseline.  Psychiatric:        Mood and Affect: Mood normal.        Behavior: Behavior normal.        Thought Content: Thought content normal.        Judgment: Judgment normal.     Results for orders placed or performed in visit on 09/06/23  Measles/Mumps/Rubella Immunity   Collection Time: 09/06/23  3:00 PM  Result Value Ref Range   Rubella Antibodies, IGG 23.10 Immune >0.99 index    RUBEOLA AB, IGG >300.0 Immune >16.4 AU/mL   MUMPS ABS, IGG >300.0 Immune >10.9 AU/mL      Assessment & Plan:   Problem List Items Addressed This Visit       Cardiovascular and Mediastinum   Primary hypertension   Under good control on current regimen. Continue current regimen. Continue to monitor. Call with any concerns. Refills given.  Labs drawn today.      Relevant Medications   chlorthalidone  (HYGROTON ) 25 MG tablet   Other Relevant Orders   Comprehensive metabolic panel with GFR   Lipid Panel w/o Chol/HDL Ratio     Endocrine   Impaired fasting glucose - Primary   Rechecking labs today. Await results. Treat as needed.       Relevant Orders   Comprehensive metabolic panel with GFR   Bayer DCA Hb A1c Waived     Genitourinary   Benign prostatic hyperplasia   Under good control on current regimen. Continue current regimen. Continue to monitor. Call with any concerns. Labs drawn today.        Relevant Orders   Comprehensive metabolic panel with GFR   PSA     Other   Vitamin D  deficiency   Rechecking labs today. Await results. Treat as needed.       Relevant Orders   VITAMIN D  25 Hydroxy (Vit-D Deficiency, Fractures)   Other Visit Diagnoses       Need for hepatitis C screening test       Labs drawn today. Await results.   Relevant Orders   Hepatitis C Antibody        Follow up plan: Return in about 7 months (around 06/04/2024).

## 2023-10-25 NOTE — Assessment & Plan Note (Signed)
Under good control on current regimen. Continue current regimen. Continue to monitor. Call with any concerns. Labs drawn today.  

## 2023-10-25 NOTE — Assessment & Plan Note (Signed)
 Under good control on current regimen. Continue current regimen. Continue to monitor. Call with any concerns. Refills given. Labs drawn today.

## 2023-10-26 ENCOUNTER — Encounter: Payer: Self-pay | Admitting: Family Medicine

## 2023-10-26 LAB — COMPREHENSIVE METABOLIC PANEL WITH GFR
ALT: 21 IU/L (ref 0–44)
AST: 19 IU/L (ref 0–40)
Albumin: 4.1 g/dL (ref 3.8–4.8)
Alkaline Phosphatase: 53 IU/L (ref 44–121)
BUN/Creatinine Ratio: 22 (ref 10–24)
BUN: 16 mg/dL (ref 8–27)
Bilirubin Total: 0.7 mg/dL (ref 0.0–1.2)
CO2: 25 mmol/L (ref 20–29)
Calcium: 9.1 mg/dL (ref 8.6–10.2)
Chloride: 104 mmol/L (ref 96–106)
Creatinine, Ser: 0.74 mg/dL — ABNORMAL LOW (ref 0.76–1.27)
Globulin, Total: 2 g/dL (ref 1.5–4.5)
Glucose: 93 mg/dL (ref 70–99)
Potassium: 3.7 mmol/L (ref 3.5–5.2)
Sodium: 142 mmol/L (ref 134–144)
Total Protein: 6.1 g/dL (ref 6.0–8.5)
eGFR: 94 mL/min/{1.73_m2} (ref >59)

## 2023-10-26 LAB — HEPATITIS C ANTIBODY: Hep C Virus Ab: NONREACTIVE

## 2023-10-26 LAB — LIPID PANEL W/O CHOL/HDL RATIO
Cholesterol, Total: 178 mg/dL (ref 100–199)
HDL: 52 mg/dL (ref >39)
LDL Chol Calc (NIH): 102 mg/dL — ABNORMAL HIGH (ref 0–99)
Triglycerides: 136 mg/dL (ref 0–149)
VLDL Cholesterol Cal: 24 mg/dL (ref 5–40)

## 2023-10-26 LAB — PSA: Prostate Specific Ag, Serum: 0.9 ng/mL (ref 0.0–4.0)

## 2023-10-26 LAB — VITAMIN D 25 HYDROXY (VIT D DEFICIENCY, FRACTURES): Vit D, 25-Hydroxy: 36.5 ng/mL (ref 30.0–100.0)

## 2023-10-30 ENCOUNTER — Ambulatory Visit (INDEPENDENT_AMBULATORY_CARE_PROVIDER_SITE_OTHER): Payer: Self-pay | Admitting: Emergency Medicine

## 2023-10-30 VITALS — Ht 67.0 in | Wt 211.0 lb

## 2023-10-30 DIAGNOSIS — Z0000 Encounter for general adult medical examination without abnormal findings: Secondary | ICD-10-CM | POA: Diagnosis not present

## 2023-10-30 NOTE — Patient Instructions (Addendum)
 Mr. Logan Hartman , Thank you for taking time to come for your Medicare Wellness Visit. I appreciate your ongoing commitment to your health goals. Please review the following plan we discussed and let me know if I can assist you in the future.   Referrals/Orders/Follow-Ups/Clinician Recommendations: Get the Shingrix (shingles) vaccine at your local pharmacy.  This is a list of the screening recommended for you and due dates:  Health Maintenance  Topic Date Due   Zoster (Shingles) Vaccine (1 of 2) Never done   Flu Shot  01/25/2024   Medicare Annual Wellness Visit  10/29/2024   DTaP/Tdap/Td vaccine (4 - Td or Tdap) 06/22/2027   Pneumonia Vaccine  Completed   COVID-19 Vaccine  Completed   Hepatitis C Screening  Completed   HPV Vaccine  Aged Out   Meningitis B Vaccine  Aged Out    Advanced directives: (Copy Requested) Please bring a copy of your health care power of attorney and living will to the office to be added to your chart at your convenience. You can mail to [facility] [address] [place], [place] [address] or email to [email or web address]  Next Medicare Annual Wellness Visit scheduled for next year: Yes, 11/04/24 @ 2:30pm (phone visit)  Have you seen your provider in the last 6 months (3 months if uncontrolled diabetes)? Yes

## 2023-10-30 NOTE — Progress Notes (Signed)
 Subjective:   Logan Hartman is a 78 y.o. who presents for a Medicare Wellness preventive visit.  Visit Complete: Virtual I connected with  Logan Hartman on 10/30/23 by a audio enabled telemedicine application and verified that I am speaking with the correct person using two identifiers.  Patient Location: Home  Provider Location: Home Office  I discussed the limitations of evaluation and management by telemedicine. The patient expressed understanding and agreed to proceed.  Vital Signs: Because this visit was a virtual/telehealth visit, some criteria may be missing or patient reported. Any vitals not documented were not able to be obtained and vitals that have been documented are patient reported.  VideoDeclined- This patient declined Librarian, academic. Therefore the visit was completed with audio only.  Persons Participating in Visit:  [name], wife and patient was present during visit.  AWV Questionnaire: Yes: Patient Medicare AWV questionnaire was completed by the patient on 10/29/23; I have confirmed that all information answered by patient is correct and no changes since this date.  Cardiac Risk Factors include: advanced age (>58men, >28 women);male gender;obesity (BMI >30kg/m2);hypertension     Objective:    Today's Vitals   10/30/23 1423  Weight: 211 lb (95.7 kg)  Height: 5\' 7"  (1.702 m)   Body mass index is 33.05 kg/m.     10/30/2023    2:33 PM 10/24/2022    3:46 PM  Advanced Directives  Does Patient Have a Medical Advance Directive? Yes No  Type of Estate agent of [place];Living will   Does patient want to make changes to medical advance directive? No - Patient declined   Copy of Healthcare Power of Attorney in Chart? No - copy requested   Would patient like information on creating a medical advance directive?  No - Patient declined    Current Medications (verified) Outpatient Encounter Medications as of 10/30/2023   Medication Sig   aspirin  EC 81 MG tablet Take 1 tablet (81 mg total) by mouth daily. Swallow whole.   chlorthalidone  (HYGROTON ) 25 MG tablet Take 1 tablet (25 mg total) by mouth daily.   Multiple Vitamin (MULTIVITAMIN WITH MINERALS) TABS tablet Take 1 tablet by mouth daily.   Multiple Vitamins-Minerals (PRESERVISION AREDS PO) Take by mouth.   sildenafil  (REVATIO ) 20 MG tablet TAKE 1 TO 5 TABLETS BY MOUTH AS NEEDED   triamcinolone  ointment (KENALOG ) 0.5 % Apply 1 Application topically 2 (two) times daily.   No facility-administered encounter medications on file as of 10/30/2023.    Allergies (verified) Patient has no known allergies.   History: Past Medical History:  Diagnosis Date   Allergy 1999   Dust, pollen, etc   Anxiety Most of my life   Family & War   Arthritis As I got older   Working in Sales promotion account executive gun   Cataract 1998   Had it corrected in [place]    Past Surgical History:  Procedure Laterality Date   EYE SURGERY  1998   The cataract correction   VASECTOMY     Family History  Problem Relation Age of Onset   Hearing loss Mother    Varicose Veins Mother    Hypertension Mother    Alcohol abuse Father    Arthritis Father    Cancer Father    Anxiety disorder Brother    Heart attack Paternal Grandfather    Social History   Socioeconomic History   Marital status: Married    Spouse name: [name]   Number of children: 2  Years of education: Not on file   Highest education level: 12th grade  Occupational History   Occupation: retired  Tobacco Use   Smoking status: Former    Current packs/day: 0.00    Average packs/day: 2.0 packs/day for 15.0 years (30.0 ttl pk-yrs)    Types: Cigarettes    Start date: 10/04/1958    Quit date: 10/03/1973    Years since quitting: 50.1   Smokeless tobacco: Never  Vaping Use   Vaping status: Never Used  Substance and Sexual Activity   Alcohol use: Yes    Alcohol/week: 1.0 standard drink of alcohol    Types: 1 Standard  drinks or equivalent per week    Comment: 1 drink 1 time a week   Drug use: Never   Sexual activity: Yes    Birth control/protection: Surgical  Other Topics Concern   Not on file  Social History Narrative   Not on file   Social Drivers of Health   Financial Resource Strain: Low Risk  (10/30/2023)   Overall Financial Resource Strain (CARDIA)    Difficulty of Paying Living Expenses: Not hard at all  Food Insecurity: No Food Insecurity (10/30/2023)   Hunger Vital Sign    Worried About Running Out of Food in the Last Year: Never true    Ran Out of Food in the Last Year: Never true  Transportation Needs: No Transportation Needs (10/30/2023)   PRAPARE - Administrator, Civil Service (Medical): No    Lack of Transportation (Non-Medical): No  Physical Activity: Insufficiently Active (10/30/2023)   Exercise Vital Sign    Days of Exercise per Week: 3 days    Minutes of Exercise per Session: 30 min  Stress: No Stress Concern Present (10/30/2023)   [employer] of Occupational Health - Occupational Stress Questionnaire    Feeling of Stress : Not at all  Social Connections: Moderately Integrated (10/30/2023)   Social Connection and Isolation Panel [NHANES]    Frequency of Communication with Friends and Family: More than three times a week    Frequency of Social Gatherings with Friends and Family: Twice a week    Attends Religious Services: More than 4 times per year    Active Member of [employer] or Organizations: No    Attends Engineer, structural: Never    Marital Status: Married    Tobacco Counseling Counseling given: Not Answered    Clinical Intake:  Pre-visit preparation completed: Yes  Pain : No/denies pain     BMI - recorded: 33.05 Nutritional Status: BMI > 30  Obese Nutritional Risks: None Diabetes: No  Lab Results  Component Value Date   HGBA1C 4.8 10/25/2023   HGBA1C 5.1 05/30/2023     How often do you need to have someone help you when you read  instructions, pamphlets, or other written materials from your doctor or pharmacy?: 1 - Never  Interpreter Needed?: No  Information entered by :: Jaunita Messier, CMA   Activities of Daily Living     10/30/2023    2:25 PM 10/29/2023    8:34 AM  In your present state of health, do you have any difficulty performing the following activities:  Hearing? 0 0  Vision? 0 0  Difficulty concentrating or making decisions? 0 0  Walking or climbing stairs? 0 0  Dressing or bathing? 0 0  Doing errands, shopping? 0 0  Preparing Food and eating ? N N  Using the Toilet? N N  In the past six months,  have you accidently leaked urine? N N  Do you have problems with loss of bowel control? N N  Managing your Medications? N N  Managing your Finances? N N  Housekeeping or managing your Housekeeping? N N    Patient Care Team: Solomon Dupre, DO as PCP - General (Family Medicine) [facility] (Optometry) Cherylene Corrente, Kizzie Perks, MD (Urology) [facility] as Referring Physician (General Practice)  Indicate any recent Medical Services you may have received from other than [facility] providers in the past year (date may be approximate).     Assessment:   This is a routine wellness examination for [place].  Hearing/Vision screen Hearing Screening - Comments:: Denies hearing loss Vision Screening - Comments:: Gets routine eye exams, Dr. Violet Grew [place]   Goals Addressed             This Visit's Progress    Patient Stated       Finish renovating home       Depression Screen     10/30/2023    2:31 PM 10/25/2023    8:28 AM 05/30/2023    9:41 AM 04/05/2023    3:42 PM 10/24/2022    3:44 PM 06/07/2022    1:13 PM  PHQ 2/9 Scores  PHQ - 2 Score 0 0 0 0 0 0  PHQ- 9 Score 0 0 0 0 0 2    Fall Risk     10/30/2023    2:34 PM 10/29/2023    8:34 AM 05/30/2023    9:41 AM 04/05/2023    3:42 PM 10/24/2022    3:47 PM  Fall Risk   Falls in the past year? 0 0 0 0 0  Number falls in past yr: 0 0 0  0 0  Injury with Fall? 0 0 0 0 0  Risk for fall due to : No Fall Risks  No Fall Risks No Fall Risks No Fall Risks  Follow up Falls prevention discussed;Falls evaluation completed  Falls evaluation completed Falls evaluation completed Falls prevention discussed;Falls evaluation completed    MEDICARE RISK AT HOME:  Medicare Risk at Home Any stairs in or around the home?: Yes If so, are there any without handrails?: No Home free of loose throw rugs in walkways, pet beds, electrical cords, etc?: Yes Adequate lighting in your home to reduce risk of falls?: Yes Life alert?: No Use of a cane, walker or w/c?: No Grab bars in the bathroom?: Yes Shower chair or bench in shower?: Yes Elevated toilet seat or a handicapped toilet?: No  TIMED UP AND GO:  Was the test performed?  No  Cognitive Function: 6CIT completed        10/30/2023    2:35 PM 10/24/2022    3:51 PM  6CIT Screen  What Year? 0 points 0 points  What month? 0 points 0 points  What time? 0 points 3 points  Count back from 20 0 points 0 points  Months in reverse 2 points 4 points  Repeat phrase 2 points 4 points  Total Score 4 points 11 points    Immunizations Immunization History  Administered Date(s) Administered   Fluad Quad(high Dose 65+) 06/13/2021, 04/21/2022   Fluad Trivalent(High Dose 65+) 03/15/2023   Influenza, High Dose Seasonal PF 04/21/2022   PFIZER(Purple Top)SARS-COV-2 Vaccination 08/12/2019, 09/02/2019, 04/08/2020   PNEUMOCOCCAL CONJUGATE-20 06/13/2021   Pfizer Covid-19 Vaccine Bivalent Booster 6yrs & up 03/04/2021   Pfizer(Comirnaty)Fall Seasonal Vaccine 12 years and older 05/12/2022, 03/08/2023, 10/19/2023  Td (Adult) 06/21/2017   Td,absorbed, Preservative Free, Adult Use, Lf Unspecified 06/21/2017   Tdap 11/30/2006    Screening Tests Health Maintenance  Topic Date Due   Zoster Vaccines- Shingrix (1 of 2) Never done   INFLUENZA VACCINE  01/25/2024   Medicare Annual Wellness (AWV)  10/29/2024    DTaP/Tdap/Td (4 - Td or Tdap) 06/22/2027   Pneumonia Vaccine 60+ Years old  Completed   COVID-19 Vaccine  Completed   Hepatitis C Screening  Completed   HPV VACCINES  Aged Out   Meningococcal B Vaccine  Aged Out    Health Maintenance  Health Maintenance Due  Topic Date Due   Zoster Vaccines- Shingrix (1 of 2) Never done   Health Maintenance Items Addressed: See Nurse Notes  Additional Screening:  Vision Screening: Recommended annual ophthalmology exams for early detection of glaucoma and other disorders of the eye.  Dental Screening: Recommended annual dental exams for proper oral hygiene  Community Resource Referral / Chronic Care Management: CRR required this visit?  No   CCM required this visit?  No     Plan:     I have personally reviewed and noted the following in the patient's chart:   Medical and social history Use of alcohol, tobacco or illicit drugs  Current medications and supplements including opioid prescriptions. Patient is not currently taking opioid prescriptions. Functional ability and status Nutritional status Physical activity Advanced directives List of other physicians Hospitalizations, surgeries, and ER visits in previous 12 months Vitals Screenings to include cognitive, depression, and falls Referrals and appointments  In addition, I have reviewed and discussed with patient certain preventive protocols, quality metrics, and best practice recommendations. A written personalized care plan for preventive services as well as general preventive health recommendations were provided to patient.     Jaunita Messier, CMA   10/30/2023   After Visit Summary: (MyChart) Due to this being a telephonic visit, the after visit summary with patients personalized plan was offered to patient via MyChart   Notes: Please refer to Routing Comments.

## 2023-12-03 ENCOUNTER — Encounter: Payer: Self-pay | Admitting: Family Medicine

## 2023-12-04 ENCOUNTER — Other Ambulatory Visit: Payer: Self-pay | Admitting: Family Medicine

## 2023-12-04 MED ORDER — CHLORTHALIDONE 25 MG PO TABS: 25.0000 mg | ORAL_TABLET | Freq: Every day | ORAL | 1 refills | Status: DC

## 2024-03-24 DIAGNOSIS — Z961 Presence of intraocular lens: Secondary | ICD-10-CM | POA: Diagnosis not present

## 2024-03-24 DIAGNOSIS — H25812 Combined forms of age-related cataract, left eye: Secondary | ICD-10-CM | POA: Diagnosis not present

## 2024-03-24 DIAGNOSIS — H33192 Other retinoschisis and retinal cysts, left eye: Secondary | ICD-10-CM | POA: Diagnosis not present

## 2024-03-24 DIAGNOSIS — H353132 Nonexudative age-related macular degeneration, bilateral, intermediate dry stage: Secondary | ICD-10-CM | POA: Diagnosis not present

## 2024-03-26 ENCOUNTER — Encounter: Payer: Self-pay | Admitting: Family Medicine

## 2024-03-27 MED ORDER — CHLORTHALIDONE 25 MG PO TABS: 25.0000 mg | ORAL_TABLET | Freq: Every day | ORAL | 0 refills | Status: DC

## 2024-04-07 ENCOUNTER — Ambulatory Visit (INDEPENDENT_AMBULATORY_CARE_PROVIDER_SITE_OTHER)

## 2024-04-07 DIAGNOSIS — Z23 Encounter for immunization: Secondary | ICD-10-CM

## 2024-04-07 NOTE — Progress Notes (Signed)
 Patient is in office today for a nurse visit for Covid and High Dose Flu Immunizations. Patient Injection was given in the  Left deltoid. Patient tolerated injection well.

## 2024-05-26 ENCOUNTER — Ambulatory Visit (INDEPENDENT_AMBULATORY_CARE_PROVIDER_SITE_OTHER): Admitting: Family Medicine

## 2024-05-26 ENCOUNTER — Encounter: Payer: Self-pay | Admitting: Family Medicine

## 2024-05-26 VITALS — BP 117/67 | HR 53 | Temp 98.0°F | Ht 67.0 in | Wt 215.8 lb

## 2024-05-26 DIAGNOSIS — I10 Essential (primary) hypertension: Secondary | ICD-10-CM

## 2024-05-26 DIAGNOSIS — Z136 Encounter for screening for cardiovascular disorders: Secondary | ICD-10-CM

## 2024-05-26 DIAGNOSIS — N401 Enlarged prostate with lower urinary tract symptoms: Secondary | ICD-10-CM

## 2024-05-26 DIAGNOSIS — E559 Vitamin D deficiency, unspecified: Secondary | ICD-10-CM

## 2024-05-26 DIAGNOSIS — R7301 Impaired fasting glucose: Secondary | ICD-10-CM

## 2024-05-26 NOTE — Progress Notes (Signed)
 Patient is 4 days early for physical- will reschedule for next week

## 2024-05-26 NOTE — Addendum Note (Signed)
 Addended by: VICCI DUWAINE SQUIBB on: 05/26/2024 09:52 AM   Modules accepted: Orders

## 2024-05-27 ENCOUNTER — Ambulatory Visit: Payer: Self-pay | Admitting: Family Medicine

## 2024-05-27 LAB — COMPREHENSIVE METABOLIC PANEL WITH GFR
ALT: 18 IU/L (ref 0–44)
AST: 21 IU/L (ref 0–40)
Albumin: 4.1 g/dL (ref 3.8–4.8)
Alkaline Phosphatase: 57 IU/L (ref 47–123)
BUN/Creatinine Ratio: 19 (ref 10–24)
BUN: 13 mg/dL (ref 8–27)
Bilirubin Total: 0.9 mg/dL (ref 0.0–1.2)
CO2: 24 mmol/L (ref 20–29)
Calcium: 9.1 mg/dL (ref 8.6–10.2)
Chloride: 103 mmol/L (ref 96–106)
Creatinine, Ser: 0.69 mg/dL — ABNORMAL LOW (ref 0.76–1.27)
Globulin, Total: 1.9 g/dL (ref 1.5–4.5)
Glucose: 97 mg/dL (ref 70–99)
Potassium: 3.9 mmol/L (ref 3.5–5.2)
Sodium: 141 mmol/L (ref 134–144)
Total Protein: 6 g/dL (ref 6.0–8.5)
eGFR: 95 mL/min/1.73 (ref >59)

## 2024-05-27 LAB — CBC WITH DIFFERENTIAL/PLATELET
Basophils Absolute: 0 x10E3/uL (ref 0.0–0.2)
Basos: 1 %
EOS (ABSOLUTE): 0.1 x10E3/uL (ref 0.0–0.4)
Eos: 2 %
Hematocrit: 40.4 % (ref 37.5–51.0)
Hemoglobin: 13.2 g/dL (ref 13.0–17.7)
Immature Grans (Abs): 0 x10E3/uL (ref 0.0–0.1)
Immature Granulocytes: 0 %
Lymphocytes Absolute: 2.1 x10E3/uL (ref 0.7–3.1)
Lymphs: 39 %
MCH: 30.4 pg (ref 26.6–33.0)
MCHC: 32.7 g/dL (ref 31.5–35.7)
MCV: 93 fL (ref 79–97)
Monocytes Absolute: 0.4 x10E3/uL (ref 0.1–0.9)
Monocytes: 8 %
Neutrophils Absolute: 2.8 x10E3/uL (ref 1.4–7.0)
Neutrophils: 50 %
Platelets: 224 x10E3/uL (ref 150–450)
RBC: 4.34 x10E6/uL (ref 4.14–5.80)
RDW: 12.5 % (ref 11.6–15.4)
WBC: 5.4 x10E3/uL (ref 3.4–10.8)

## 2024-05-27 LAB — PSA: Prostate Specific Ag, Serum: 0.8 ng/mL (ref 0.0–4.0)

## 2024-05-27 LAB — VITAMIN D 25 HYDROXY (VIT D DEFICIENCY, FRACTURES): Vit D, 25-Hydroxy: 29.5 ng/mL — ABNORMAL LOW (ref 30.0–100.0)

## 2024-05-27 LAB — LIPID PANEL W/O CHOL/HDL RATIO
Cholesterol, Total: 169 mg/dL (ref 100–199)
HDL: 52 mg/dL (ref >39)
LDL Chol Calc (NIH): 99 mg/dL (ref 0–99)
Triglycerides: 96 mg/dL (ref 0–149)
VLDL Cholesterol Cal: 18 mg/dL (ref 5–40)

## 2024-05-27 LAB — BAYER DCA HB A1C WAIVED: HB A1C (BAYER DCA - WAIVED): 5.1 % (ref 4.8–5.6)

## 2024-05-27 LAB — TSH: TSH: 2.06 u[IU]/mL (ref 0.450–4.500)

## 2024-06-09 ENCOUNTER — Ambulatory Visit: Admitting: Family Medicine

## 2024-06-09 ENCOUNTER — Encounter: Payer: Self-pay | Admitting: Family Medicine

## 2024-06-09 VITALS — BP 133/75 | HR 46 | Temp 97.8°F | Ht 67.0 in | Wt 209.0 lb

## 2024-06-09 DIAGNOSIS — R7301 Impaired fasting glucose: Secondary | ICD-10-CM | POA: Diagnosis not present

## 2024-06-09 DIAGNOSIS — Z0000 Encounter for general adult medical examination without abnormal findings: Secondary | ICD-10-CM

## 2024-06-09 DIAGNOSIS — I10 Essential (primary) hypertension: Secondary | ICD-10-CM | POA: Diagnosis not present

## 2024-06-09 DIAGNOSIS — N400 Enlarged prostate without lower urinary tract symptoms: Secondary | ICD-10-CM | POA: Diagnosis not present

## 2024-06-09 LAB — MICROALBUMIN, URINE WAIVED
Creatinine, Urine Waived: 100 mg/dL (ref 10–300)
Microalb, Ur Waived: 10 mg/L (ref 0–19)
Microalb/Creat Ratio: <30 mg/g (ref <30)

## 2024-06-09 MED ORDER — TRIAMCINOLONE ACETONIDE 0.5 % EX OINT: 1.0000 | TOPICAL_OINTMENT | Freq: Two times a day (BID) | CUTANEOUS | 3 refills | Status: DC

## 2024-06-09 MED ORDER — CHLORTHALIDONE 25 MG PO TABS: 25.0000 mg | ORAL_TABLET | Freq: Every day | ORAL | 1 refills | Status: DC

## 2024-06-09 NOTE — Assessment & Plan Note (Signed)
 Under good control on current regimen. Continue current regimen. Continue to monitor. Call with any concerns. Refills given. Labs drawn today.

## 2024-06-09 NOTE — Assessment & Plan Note (Signed)
 Labs doing great. Continue to monitor. Call with any concerns.

## 2024-06-09 NOTE — Progress Notes (Signed)
 BP 133/75 (BP Location: Left Arm, Cuff Size: Large)   Pulse (!) 46   Temp 97.8 F (36.6 C) (Oral)   Ht 5' 7 (1.702 m)   Wt 209 lb (94.8 kg)   SpO2 97%   BMI 32.73 kg/m    Subjective:    Patient ID: Logan Hartman, male    DOB: 1945-11-05, 78 y.o.   MRN: [id number]  HPI: Logan Hartman is a 78 y.o. male presenting on 06/09/2024 for comprehensive medical examination. Current medical complaints include:  Impaired Fasting Glucose HbA1C:  Lab Results  Component Value Date   HGBA1C 5.1 05/26/2024   Duration of elevated blood sugar: chronic Polydipsia: no Polyuria: no Weight change: no Visual disturbance: no Glucose Monitoring: no Diabetic Education: Completed Family history of diabetes: yes  HYPERTENSION  Hypertension status: controlled  Satisfied with current treatment? yes Duration of hypertension: chronic BP monitoring frequency:  not checking BP medication side effects:  no Medication compliance: excellent compliance Previous BP meds: chlorthalidone  Aspirin : no Recurrent headaches: no Visual changes: no Palpitations: no Dyspnea: no Chest pain: no Lower extremity edema: no Dizzy/lightheaded: no  BPH BPH status: controlled Satisfied with current treatment?: yes Medication side effects: no Medication compliance: excellent compliance Duration: chronic Nocturia: 1/night Urinary frequency:no Incomplete voiding: no Urgency: no Weak urinary stream: no Straining to start stream: no Dysuria: no Onset: gradual Severity: mild   Interim Problems from his last visit: no  Depression Screen done today and results listed below:     05/26/2024    8:53 AM 10/30/2023    2:31 PM 10/25/2023    8:28 AM 05/30/2023    9:41 AM 04/05/2023    3:42 PM  Depression screen PHQ 2/9  Decreased Interest 0 0 0 0 0  Down, Depressed, Hopeless 0 0 0 0 0  PHQ - 2 Score 0 0 0 0 0  Altered sleeping 0 0 0 0 0  Tired, decreased energy 0 0 0 0 0  Change in appetite 0 0 0 0 0  Feeling  bad or failure about yourself  0 0 0 0 0  Trouble concentrating 0 0 0 0 0  Moving slowly or fidgety/restless 0 0 0 0 0  Suicidal thoughts 0 0 0 0 0  PHQ-9 Score 0 0  0  0  0   Difficult doing work/chores Not difficult at all Not difficult at all Not difficult at all Not difficult at all Not difficult at all     Data saved with a previous flowsheet row definition    Past Medical History:  Past Medical History:  Diagnosis Date   Allergy 1999   Dust, pollen, etc   Anxiety Most of my life   Family & War   Arthritis As I got older   Working in sales promotion account executive gun   Cataract 1998   Had it corrected in [place]    Hypertension     Surgical History:  Past Surgical History:  Procedure Laterality Date   CATARACT EXTRACTION Left 06/02/2024   EYE SURGERY  1998   The cataract correction   VASECTOMY      Medications:  Current Outpatient Medications on File Prior to Visit  Medication Sig   aspirin  EC 81 MG tablet Take 1 tablet (81 mg total) by mouth daily. Swallow whole.   BESIVANCE 0.6 % SUSP Apply 1 drop to eye 4 (four) times daily.   Bromfenac Sodium 0.07 % SOLN Apply 1 drop to eye 2 (two) times daily.  DUREZOL 0.05 % EMUL Place 1 drop into the left eye 4 (four) times daily.   Multiple Vitamin (MULTIVITAMIN WITH MINERALS) TABS tablet Take 1 tablet by mouth daily.   Multiple Vitamins-Minerals (PRESERVISION AREDS PO) Take by mouth.   prednisoLONE acetate (PRED FORTE) 1 % ophthalmic suspension 1 drop 4 (four) times daily.   sildenafil  (REVATIO ) 20 MG tablet TAKE 1 TO 5 TABLETS BY MOUTH AS NEEDED   No current facility-administered medications on file prior to visit.    Allergies:  Allergies[1]  Social History:  Social History   Socioeconomic History   Marital status: Married    Spouse name: Hadassah   Number of children: 2   Years of education: Not on file   Highest education level: 12th grade  Occupational History   Occupation: retired  Tobacco Use   Smoking status:  Former    Current packs/day: 0.00    Average packs/day: 2.0 packs/day for 15.0 years (30.0 ttl pk-yrs)    Types: Cigarettes    Start date: 10/04/1958    Quit date: 10/03/1973    Years since quitting: 50.7   Smokeless tobacco: Never  Vaping Use   Vaping status: Never Used  Substance and Sexual Activity   Alcohol use: Yes    Alcohol/week: 1.0 standard drink of alcohol    Types: 1 Standard drinks or equivalent per week    Comment: 1 drink 1 time a week   Drug use: Not Currently    Types: Marijuana    Comment: Use was back in 1968-1974   Sexual activity: Yes    Birth control/protection: Surgical  Other Topics Concern   Not on file  Social History Narrative   Not on file   Social Drivers of Health   Tobacco Use: Medium Risk (06/09/2024)   Patient History    Smoking Tobacco Use: Former    Smokeless Tobacco Use: Never    Passive Exposure: Not on Actuary Strain: Low Risk (05/25/2024)   Overall Financial Resource Strain (CARDIA)    Difficulty of Paying Living Expenses: Not hard at all  Food Insecurity: No Food Insecurity (05/25/2024)   Epic    Worried About Programme Researcher, Broadcasting/film/video in the Last Year: Never true    Ran Out of Food in the Last Year: Never true  Transportation Needs: No Transportation Needs (05/25/2024)   Epic    Lack of Transportation (Medical): No    Lack of Transportation (Non-Medical): No  Physical Activity: Insufficiently Active (05/25/2024)   Exercise Vital Sign    Days of Exercise per Week: 3 days    Minutes of Exercise per Session: 30 min  Stress: No Stress Concern Present (05/25/2024)   [employer] of Occupational Health - Occupational Stress Questionnaire    Feeling of Stress: Only a little  Social Connections: Socially Integrated (05/25/2024)   Social Connection and Isolation Panel    Frequency of Communication with Friends and Family: Once a week    Frequency of Social Gatherings with Friends and Family: Three times a week     Attends Religious Services: More than 4 times per year    Active Member of Clubs or Organizations: Yes    Attends Banker Meetings: More than 4 times per year    Marital Status: Married  Catering Manager Violence: Not At Risk (10/30/2023)   Humiliation, Afraid, Rape, and Kick questionnaire    Fear of Current or Ex-Partner: No    Emotionally Abused: No    Physically Abused:  No    Sexually Abused: No  Depression (PHQ2-9): Low Risk (05/26/2024)   Depression (PHQ2-9)    PHQ-2 Score: 0  Alcohol Screen: Low Risk (05/25/2024)   Alcohol Screen    Last Alcohol Screening Score (AUDIT): 1  Housing: Low Risk (05/25/2024)   Epic    Unable to Pay for Housing in the Last Year: No    Number of Times Moved in the Last Year: 0    Homeless in the Last Year: No  Utilities: Not At Risk (10/30/2023)   AHC Utilities    Threatened with loss of utilities: No  Health Literacy: Adequate Health Literacy (10/30/2023)   B1300 Health Literacy    Frequency of need for help with medical instructions: Never   Tobacco Use History[2] Social History   Substance and Sexual Activity  Alcohol Use Yes   Alcohol/week: 1.0 standard drink of alcohol   Types: 1 Standard drinks or equivalent per week   Comment: 1 drink 1 time a week    Family History:  Family History  Problem Relation Age of Onset   Hearing loss Mother    Varicose Veins Mother    Hypertension Mother    Alcohol abuse Father    Arthritis Father    Cancer Father    Anxiety disorder Brother    Heart attack Paternal Grandfather     Past medical history, surgical history, medications, allergies, family history and social history reviewed with patient today and changes made to appropriate areas of the chart.   Review of Systems  Constitutional: Negative.   HENT: Negative.    Eyes: Negative.   Respiratory:  Positive for cough (in the AM). Negative for hemoptysis, sputum production, shortness of breath and wheezing.   Cardiovascular:  Negative.   Gastrointestinal: Negative.   Genitourinary: Negative.   Musculoskeletal: Negative.   Skin:  Positive for rash. Negative for itching.  Neurological: Negative.   Endo/Heme/Allergies: Negative.   Psychiatric/Behavioral: Negative.     All other ROS negative except what is listed above and in the HPI.      Objective:    BP 133/75 (BP Location: Left Arm, Cuff Size: Large)   Pulse (!) 46   Temp 97.8 F (36.6 C) (Oral)   Ht 5' 7 (1.702 m)   Wt 209 lb (94.8 kg)   SpO2 97%   BMI 32.73 kg/m   Wt Readings from Last 3 Encounters:  06/09/24 209 lb (94.8 kg)  05/26/24 215 lb 12.8 oz (97.9 kg)  10/30/23 211 lb (95.7 kg)    Physical Exam Vitals and nursing note reviewed.  Constitutional:      General: He is not in acute distress.    Appearance: Normal appearance. He is obese. He is not ill-appearing, toxic-appearing or diaphoretic.  HENT:     Head: Normocephalic and atraumatic.     Right Ear: Tympanic membrane, ear canal and external ear normal. There is no impacted cerumen.     Left Ear: Tympanic membrane, ear canal and external ear normal. There is no impacted cerumen.     Nose: Nose normal. No congestion or rhinorrhea.     Mouth/Throat:     Mouth: Mucous membranes are moist.     Pharynx: Oropharynx is clear. No oropharyngeal exudate or posterior oropharyngeal erythema.  Eyes:     General: No scleral icterus.       Right eye: No discharge.        Left eye: No discharge.     Extraocular Movements: Extraocular movements  intact.     Conjunctiva/sclera: Conjunctivae normal.     Pupils: Pupils are equal, round, and reactive to light.  Neck:     Vascular: No carotid bruit.  Cardiovascular:     Rate and Rhythm: Normal rate and regular rhythm.     Pulses: Normal pulses.     Heart sounds: No murmur heard.    No friction rub. No gallop.  Pulmonary:     Effort: Pulmonary effort is normal. No respiratory distress.     Breath sounds: Normal breath sounds. No stridor. No  wheezing, rhonchi or rales.  Chest:     Chest wall: No tenderness.  Abdominal:     General: Abdomen is flat. Bowel sounds are normal. There is no distension.     Palpations: Abdomen is soft. There is no mass.     Tenderness: There is no abdominal tenderness. There is no right CVA tenderness, left CVA tenderness, guarding or rebound.     Hernia: No hernia is present.  Genitourinary:    Comments: Genital exam deferred with shared decision making Musculoskeletal:        General: No swelling, tenderness, deformity or signs of injury.     Cervical back: Normal range of motion and neck supple. No rigidity. No muscular tenderness.     Right lower leg: No edema.     Left lower leg: No edema.  Lymphadenopathy:     Cervical: No cervical adenopathy.  Skin:    General: Skin is warm and dry.     Capillary Refill: Capillary refill takes less than 2 seconds.     Coloration: Skin is not jaundiced or pale.     Findings: No bruising, erythema, lesion or rash.  Neurological:     General: No focal deficit present.     Mental Status: He is alert and oriented to person, place, and time.     Cranial Nerves: No cranial nerve deficit.     Sensory: No sensory deficit.     Motor: No weakness.     Coordination: Coordination normal.     Gait: Gait normal.     Deep Tendon Reflexes: Reflexes normal.  Psychiatric:        Mood and Affect: Mood normal.        Behavior: Behavior normal.        Thought Content: Thought content normal.        Judgment: Judgment normal.     Results for orders placed or performed in visit on 06/09/24  Microalbumin, Urine Waived   Collection Time: 06/09/24 10:41 AM  Result Value Ref Range   Microalb, Ur Waived 10 0 - 19 mg/L   Creatinine, Urine Waived 100 10 - 300 mg/dL   Microalb/Creat Ratio <30 <30 mg/g      Assessment & Plan:   Problem List Items Addressed This Visit       Cardiovascular and Mediastinum   Primary hypertension   Under good control on current  regimen. Continue current regimen. Continue to monitor. Call with any concerns. Refills given. Labs drawn today.        Relevant Medications   chlorthalidone  (HYGROTON ) 25 MG tablet     Endocrine   Impaired fasting glucose   Labs doing great. Continue to monitor. Call with any concerns.         Genitourinary   Benign prostatic hyperplasia without lower urinary tract symptoms   Under good control on current regimen. Continue current regimen. Continue to monitor. Call with any concerns. Refills  given. Labs drawn today.       Relevant Orders   Microalbumin, Urine Waived (Completed)   Other Visit Diagnoses       Routine general medical examination at a health care facility    -  Primary   Vaccines up to date. Screening labs checked today. Continue diet and exercise. Call with any concerns. Continue to monitor.        Discussed aspirin  prophylaxis for myocardial infarction prevention and decision was made to continue ASA  LABORATORY TESTING:  Health maintenance labs ordered today as discussed above.   The natural history of prostate cancer and ongoing controversy regarding screening and potential treatment outcomes of prostate cancer has been discussed with the patient. The meaning of a false positive PSA and a false negative PSA has been discussed. He indicates understanding of the limitations of this screening test and wishes to proceed with screening PSA testing.   IMMUNIZATIONS:   - Tdap: Tetanus vaccination status reviewed: last tetanus booster within 10 years. - Influenza: Up to date - Pneumovax: Up to date - Prevnar: Up to date - COVID: Up to date - HPV: Not applicable  PATIENT COUNSELING:    Sexuality: Discussed sexually transmitted diseases, partner selection, use of condoms, avoidance of unintended pregnancy  and contraceptive alternatives.   Advised to avoid cigarette smoking.  I discussed with the patient that most people either abstain from alcohol or drink  within safe limits (<=14/week and <=4 drinks/occasion for males, <=7/weeks and <= 3 drinks/occasion for females) and that the risk for alcohol disorders and other health effects rises proportionally with the number of drinks per week and how often a drinker exceeds daily limits.  Discussed cessation/primary prevention of drug use and availability of treatment for abuse.   Diet: Encouraged to adjust caloric intake to maintain  or achieve ideal body weight, to reduce intake of dietary saturated fat and total fat, to limit sodium intake by avoiding high sodium foods and not adding table salt, and to maintain adequate dietary potassium and calcium preferably from fresh fruits, vegetables, and low-fat dairy products.    stressed the importance of regular exercise  Injury prevention: Discussed safety belts, safety helmets, smoke detector, smoking near bedding or upholstery.   Dental health: Discussed importance of regular tooth brushing, flossing, and dental visits.   Follow up plan: NEXT PREVENTATIVE PHYSICAL DUE IN 1 YEAR. Return in about 22 weeks (around 11/10/2024).     [1] No Known Allergies [2]  Social History Tobacco Use  Smoking Status Former   Current packs/day: 0.00   Average packs/day: 2.0 packs/day for 15.0 years (30.0 ttl pk-yrs)   Types: Cigarettes   Start date: 10/04/1958   Quit date: 10/03/1973   Years since quitting: 50.7  Smokeless Tobacco Never

## 2024-07-16 ENCOUNTER — Encounter: Payer: Self-pay | Admitting: Urology

## 2024-07-16 ENCOUNTER — Ambulatory Visit: Admitting: Urology

## 2024-07-16 VITALS — BP 183/91 | HR 77 | Ht 69.0 in | Wt 214.0 lb

## 2024-07-16 DIAGNOSIS — N529 Male erectile dysfunction, unspecified: Secondary | ICD-10-CM

## 2024-07-16 DIAGNOSIS — N5201 Erectile dysfunction due to arterial insufficiency: Secondary | ICD-10-CM

## 2024-07-16 MED ORDER — SILDENAFIL CITRATE 20 MG PO TABS: ORAL_TABLET | ORAL | 6 refills | Status: AC

## 2024-07-16 NOTE — Progress Notes (Signed)
" ° °  07/16/2024 11:25 AM   Logan Hartman 06-04-1946 [id number]  Referring provider: Vicci Duwaine SQUIBB, DO [address] [place],  [place] [address]  Chief Complaint  Patient presents with   Medication Refill   Urologic history: 1.  Gross hematuria June 2020: CTU unremarkable; cystoscopy BPH with prominent vasculature/friability   2.  Erectile dysfunction Sildenafil  prn  HPI: Logan Hartman is a 79 y.o. male who presents for annual follow-up.  No significant changes since last year's visit Denies recurrent gross hematuria Urologic ROS otherwise noncontributory   PMH: Past Medical History:  Diagnosis Date   Allergy 1999   Dust, pollen, etc   Anxiety Most of my life   Family & War   Arthritis As I got older   Working in sales promotion account executive gun   Cataract 1998   Had it corrected in [place]    Hypertension     Surgical History: Past Surgical History:  Procedure Laterality Date   CATARACT EXTRACTION Left 06/02/2024   EYE SURGERY  1998   The cataract correction   VASECTOMY      Home Medications:  Allergies as of 07/16/2024   No Known Allergies      Medication List        Accurate as of July 16, 2024 11:25 AM. If you have any questions, ask your nurse or doctor.          STOP taking these medications    aspirin  EC 81 MG tablet   Besivance 0.6 % Susp Generic drug: Besifloxacin HCl   Bromfenac Sodium 0.07 % Soln   chlorthalidone  25 MG tablet Commonly known as: HYGROTON    Durezol 0.05 % Emul Generic drug: Difluprednate   multivitamin with minerals Tabs tablet   prednisoLONE acetate 1 % ophthalmic suspension Commonly known as: PRED FORTE   PRESERVISION AREDS PO   sildenafil  20 MG tablet Commonly known as: REVATIO    triamcinolone  ointment 0.5 % Commonly known as: KENALOG         Allergies: Allergies[1]  Family History: Family History  Problem Relation Age of Onset   Hearing loss Mother    Varicose Veins Mother    Hypertension Mother     Alcohol abuse Father    Arthritis Father    Cancer Father    Anxiety disorder Brother    Heart attack Paternal Grandfather     Social History:  reports that he quit smoking about 50 years ago. His smoking use included cigarettes. He started smoking about 65 years ago. He has a 30 pack-year smoking history. He has never used smokeless tobacco. He reports current alcohol use of about 1.0 standard drink of alcohol per week. He reports that he does not currently use drugs after having used the following drugs: Marijuana.   Physical Exam: BP (!) 183/91   Pulse 77   Ht 5' 9 (1.753 m)   Wt 214 lb (97.1 kg)   BMI 31.60 kg/m   Constitutional:  Alert, No acute distress. HEENT: [place] AT Respiratory: Normal respiratory effort, no increased work of breathing. Psychiatric: Normal mood and affect.   Assessment & Plan:    1.  Erectile dysfunction Stable on sildenafil , refill sent to pharmacy Continue annual follow-up    Glendia JAYSON Barba, MD  [facility] [address] [place], [place] [address] [phone]     [1] No Known Allergies  "

## 2024-07-16 NOTE — Patient Instructions (Signed)
 Preliet

## 2024-07-17 ENCOUNTER — Encounter: Payer: Self-pay | Admitting: Family Medicine

## 2024-07-18 ENCOUNTER — Ambulatory Visit: Attending: Cardiovascular Disease | Admitting: Cardiovascular Disease

## 2024-07-18 ENCOUNTER — Encounter: Payer: Self-pay | Admitting: Cardiovascular Disease

## 2024-07-18 VITALS — BP 120/54 | HR 66 | Ht 67.0 in | Wt 217.2 lb

## 2024-07-18 DIAGNOSIS — R931 Abnormal findings on diagnostic imaging of heart and coronary circulation: Secondary | ICD-10-CM | POA: Diagnosis not present

## 2024-07-18 DIAGNOSIS — I10 Essential (primary) hypertension: Secondary | ICD-10-CM | POA: Diagnosis not present

## 2024-07-18 DIAGNOSIS — E785 Hyperlipidemia, unspecified: Secondary | ICD-10-CM

## 2024-07-18 NOTE — Patient Instructions (Signed)
 Medication Instructions:  No changes *If you need a refill on your cardiac medications before your next appointment, please call your pharmacy*  Lab Work: None ordered If you have labs (blood work) drawn today and your tests are completely normal, you will receive your results only by: MyChart Message (if you have MyChart) OR A paper copy in the mail If you have any lab test that is abnormal or we need to change your treatment, we will call you to review the results.  Testing/Procedures: None ordered  Follow-Up: At [facility], you and your health needs are our priority.  As part of our continuing mission to provide you with exceptional heart care, our providers are all part of one team.  This team includes your primary Cardiologist (physician) and Advanced Practice Providers or APPs (Physician Assistants and Nurse Practitioners) who all work together to provide you with the care you need, when you need it.  Your next appointment:   Follow up as needed

## 2024-07-18 NOTE — Progress Notes (Signed)
 "    Cardiology Office Note   Date:  07/18/2024   ID:  Logan Hartman, Logan Hartman [birth year], MRN [id number]  PCP:  Vicci Duwaine SQUIBB, DO  Cardiologist:   Deatrice Cage, MD   Chief Complaint  Patient presents with   Follow-up    OD 12 Month f/u no complaints today. Meds reviewed verbally with pt.      History of Present Illness: Logan Hartman is a 79 y.o. male who is here today for follow-up visit regarding elevated coronary calcium score.   He has known history of essential hypertension and hyperlipidemia.  He had elevated blood pressure and lower extremity edema that resolved after the addition of chlorthalidone .   Cardiac CT was done in February for risk stratification and showed a calcium score of 538.  The patient declined treatment with a statin and wanted to continue with lifestyle changes.  He improved his diet and started exercising more.  His LDL improved from 146 to 99. He has been doing very well with no chest pain, shortness of breath or palpitations.    Past Medical History:  Diagnosis Date   Allergy 1999   Dust, pollen, etc   Anxiety Most of my life   Family & War   Arthritis As I got older   Working in sales promotion account executive gun   Cataract 1998   Had it corrected in [place]    Hypertension     Past Surgical History:  Procedure Laterality Date   CATARACT EXTRACTION Left 06/02/2024   EYE SURGERY  1998   The cataract correction   VASECTOMY       Current Outpatient Medications  Medication Sig Dispense Refill   chlorthalidone  (HYGROTON ) 25 MG tablet Take 25 mg by mouth daily.     sildenafil  (REVATIO ) 20 MG tablet TAKE 1 TO 5 TABLETS BY MOUTH AS NEEDED 30 tablet 6   No current facility-administered medications for this visit.    Allergies:   Patient has no known allergies.    Social History:  The patient  reports that he quit smoking about 50 years ago. His smoking use included cigarettes. He started smoking about 65 years ago. He has a 30 pack-year smoking  history. He has never used smokeless tobacco. He reports current alcohol use of about 1.0 standard drink of alcohol per week. He reports that he does not currently use drugs after having used the following drugs: Marijuana.   Family History:  The patient's family history includes Alcohol abuse in his father; Anxiety disorder in his brother; Arthritis in his father; Cancer in his father; Hearing loss in his mother; Heart attack in his paternal grandfather; Hypertension in his mother; Varicose Veins in his mother.    ROS:  Please see the history of present illness.   Otherwise, review of systems are positive for none.   All other systems are reviewed and negative.    PHYSICAL EXAM: VS:  BP (!) 120/54 (BP Location: Left Arm, Patient Position: Sitting, Cuff Size: Normal)   Pulse 66   Ht 5' 7 (1.702 m)   Wt 217 lb 4 oz (98.5 kg)   SpO2 96%   BMI 34.03 kg/m  , BMI Body mass index is 34.03 kg/m. GEN: Well nourished, well developed, in no acute distress  HEENT: normal  Neck: no JVD, carotid bruits, or masses Cardiac: RRR; no murmurs, rubs, or gallops, trace edema  Respiratory:  clear to auscultation bilaterally, normal work of breathing GI: soft, nontender, nondistended, + BS MS: no  deformity or atrophy  Skin: warm and dry, no rash Neuro:  Strength and sensation are intact Psych: euthymic mood, full affect   EKG:  EKG is ordered today. The ekg ordered today demonstrates : Sinus rhythm with marked sinus arrhythmia Nonspecific ST abnormality When compared with ECG of 24-Jul-2023 14:04, Nonspecific T wave abnormality now evident in Inferior leads        Recent Labs: 05/26/2024: ALT 18; BUN 13; Creatinine, Ser 0.69; Hemoglobin 13.2; Platelets 224; Potassium 3.9; Sodium 141; TSH 2.060    Lipid Panel    Component Value Date/Time   CHOL 169 05/26/2024 0916   TRIG 96 05/26/2024 0916   HDL 52 05/26/2024 0916   LDLCALC 99 05/26/2024 0916      Wt Readings from Last 3 Encounters:   07/18/24 217 lb 4 oz (98.5 kg)  07/16/24 214 lb (97.1 kg)  06/09/24 209 lb (94.8 kg)         07/24/2023    1:57 PM  PAD Screen  Previous PAD dx? No  Previous surgical procedure? No  Pain with walking? No  Feet/toe relief with dangling? No  Painful, non-healing ulcers? No  Extremities discolored? No      ASSESSMENT AND PLAN:  1.  Elevated CT calcium score: He improved his lifestyle significantly and lowered his LDL to 99.  I asked him to continue low-dose aspirin  81 mg once daily.  I discussed with him the indication to lower his LDL further but he does not want medications.  2.  Essential hypertension: Blood pressure is controlled on chlorthalidone .  3.  Mixed hyperlipidemia: Most recent lipid profile showed an LDL of 99 and triglycerides of 96.  Both significantly improved from last year.  He does not want medications.    Disposition:   FU as needed   Signed,  Deatrice Cage, MD  07/18/2024 9:43 AM    [facility] "

## 2024-11-04 ENCOUNTER — Ambulatory Visit

## 2024-11-10 ENCOUNTER — Ambulatory Visit: Admitting: Family Medicine

## 2025-07-17 ENCOUNTER — Ambulatory Visit: Admitting: Urology
